# Patient Record
Sex: Female | Born: 1975
Health system: Southern US, Community
[De-identification: ages and names within clinical notes are randomized; demographics above are authoritative.]

## PROBLEM LIST (undated history)

## (undated) DIAGNOSIS — O24419 Gestational diabetes mellitus in pregnancy, unspecified control: Secondary | ICD-10-CM

## (undated) DIAGNOSIS — I10 Essential (primary) hypertension: Secondary | ICD-10-CM

## (undated) HISTORY — DX: Gestational diabetes mellitus in pregnancy, unspecified control: O24.419

## (undated) HISTORY — PX: OTHER SURGICAL HISTORY: SHX169

---

## 2013-11-08 LAB — OB RESULTS CONSOLE ABO/RH: RH Type: POSITIVE

## 2013-11-08 LAB — OB RESULTS CONSOLE RPR: RPR: NONREACTIVE

## 2013-11-08 LAB — OB RESULTS CONSOLE ANTIBODY SCREEN: Antibody Screen: NEGATIVE

## 2013-11-08 LAB — OB RESULTS CONSOLE RUBELLA ANTIBODY, IGM: Rubella: IMMUNE

## 2013-11-08 LAB — OB RESULTS CONSOLE HIV ANTIBODY (ROUTINE TESTING): HIV: NONREACTIVE

## 2013-11-08 LAB — OB RESULTS CONSOLE HEPATITIS B SURFACE ANTIGEN: Hepatitis B Surface Ag: NEGATIVE

## 2013-11-21 ENCOUNTER — Other Ambulatory Visit (HOSPITAL_COMMUNITY)
Admission: RE | Admit: 2013-11-21 | Discharge: 2013-11-21 | Disposition: A | Discharge status: Home / Self Care | Payer: BC Managed Care – PPO | Source: Ambulatory Visit | Attending: Obstetrics and Gynecology | Admitting: Obstetrics and Gynecology

## 2013-11-21 DIAGNOSIS — Z01419 Encounter for gynecological examination (general) (routine) without abnormal findings: Secondary | ICD-10-CM | POA: Diagnosis present

## 2013-11-21 DIAGNOSIS — Z113 Encounter for screening for infections with a predominantly sexual mode of transmission: Secondary | ICD-10-CM | POA: Diagnosis present

## 2013-11-21 DIAGNOSIS — Z1151 Encounter for screening for human papillomavirus (HPV): Secondary | ICD-10-CM | POA: Insufficient documentation

## 2014-01-29 ENCOUNTER — Encounter: Payer: BC Managed Care – PPO | Attending: Obstetrics and Gynecology

## 2014-01-29 VITALS — Ht 64.5 in | Wt 146.9 lb

## 2014-01-29 DIAGNOSIS — Z713 Dietary counseling and surveillance: Secondary | ICD-10-CM | POA: Diagnosis not present

## 2014-01-29 DIAGNOSIS — O24419 Gestational diabetes mellitus in pregnancy, unspecified control: Secondary | ICD-10-CM | POA: Diagnosis not present

## 2014-01-29 NOTE — Progress Notes (Signed)
  Patient was seen on 01/29/14 for Gestational Diabetes self-management . The following learning objectives were met by the patient :   States the definition of Gestational Diabetes  States why dietary management is important in controlling blood glucose  Describes the effects of carbohydrates on blood glucose levels  Demonstrates ability to create a balanced meal plan  Demonstrates carbohydrate counting   States when to check blood glucose levels  Demonstrates proper blood glucose monitoring techniques  States the effect of stress and exercise on blood glucose levels  States the importance of limiting caffeine and abstaining from alcohol and smoking  Plan:  Aim for 2 Carb Choices per meal (30 grams) +/- 1 either way for breakfast Aim for 3 Carb Choices per meal (45 grams) +/- 1 either way from lunch and dinner Aim for 1-2 Carbs per snack Begin reading food labels for Total Carbohydrate and sugar grams of foods Consider  increasing your activity level by walking daily as tolerated Begin checking BG before breakfast and 2 hours after first bit of breakfast, lunch and dinner after  as directed by MD  Take medication  as directed by MD  Blood glucose monitor given: Provided by Provider  Patient instructed to monitor glucose levels: FBS: 60 - <90 2 hour: <120  Patient received the following handouts:  Nutrition Diabetes and Pregnancy  Carbohydrate Counting List  Meal Planning worksheet  Patient will be seen for follow-up as needed.

## 2014-03-14 NOTE — L&D Delivery Note (Signed)
Vaginal Delivery Note The pt utilized an  epidural as pain management.   Spontaneous rupture of membranes today, at 0001, clear.  GBS was positive, Amp x 1 doses were given.  Cervical dilation was complete at  0001.    Pushing with guidance began at  0015.   After 25 seconds of self directed pushing the head, shoulders and the body of a viable female infant "Kellie ShropshireGavin" delivered spontaneously with maternal effort in the ROA position at 0015   With vigorous tone and spontaneous cry, the infant was placed on moms abd.  The cord was clamped, cut and the infant was handed to the NICU team/nursing staff for immediate assessment .    Spontaneous delivery of a intact placenta with a 3 vessel cord via Shultz at  Dorothy0020.   Episiotomy: None   The vulva, perineum, vaginal vault, rectum and cervix were inspected no repairs needed.  Postpartum pitocin as ordered.  Fundus firm, lochia minimum, bleeding under control.  EBL 100, Pt hemodynamically stable.   Sponge, laps and needle count correct and verified with the primary care nurse.  Attending MD available at all times.    Mom and baby were left in stable condition, baby skin to skin. Routine postpartum orders   Mother unsure about method of contraception Or Mother desires BTL 3 months postpartum    Placenta to pathology: NO      Cord Gases sent to lab: NO Cord blood sent to lab: YES   APGARS:  8 at 1 minute and 9 at 5 minutes Weight:. 5lbs 14.2oz     Denver Harder, CNM, MSN 04/10/2014. 2:49 AM

## 2014-04-09 ENCOUNTER — Inpatient Hospital Stay (HOSPITAL_COMMUNITY)
Admission: AD | Admit: 2014-04-09 | Discharge: 2014-04-12 | DRG: 774 | Disposition: A | Discharge status: Home / Self Care | Payer: BLUE CROSS/BLUE SHIELD | Source: Ambulatory Visit | Attending: Obstetrics and Gynecology | Admitting: Obstetrics and Gynecology

## 2014-04-09 ENCOUNTER — Inpatient Hospital Stay (HOSPITAL_COMMUNITY): Payer: BLUE CROSS/BLUE SHIELD | Admitting: Anesthesiology

## 2014-04-09 ENCOUNTER — Encounter (HOSPITAL_COMMUNITY): Payer: Self-pay | Admitting: *Deleted

## 2014-04-09 DIAGNOSIS — O24410 Gestational diabetes mellitus in pregnancy, diet controlled: Secondary | ICD-10-CM | POA: Diagnosis present

## 2014-04-09 DIAGNOSIS — O99824 Streptococcus B carrier state complicating childbirth: Secondary | ICD-10-CM | POA: Diagnosis present

## 2014-04-09 DIAGNOSIS — Z3A35 35 weeks gestation of pregnancy: Secondary | ICD-10-CM | POA: Diagnosis present

## 2014-04-09 DIAGNOSIS — O09523 Supervision of elderly multigravida, third trimester: Secondary | ICD-10-CM | POA: Diagnosis not present

## 2014-04-09 DIAGNOSIS — O1092 Unspecified pre-existing hypertension complicating childbirth: Secondary | ICD-10-CM | POA: Diagnosis present

## 2014-04-09 HISTORY — DX: Essential (primary) hypertension: I10

## 2014-04-09 LAB — GLUCOSE, CAPILLARY: Glucose-Capillary: 94 mg/dL (ref 70–99)

## 2014-04-09 LAB — URINALYSIS, ROUTINE W REFLEX MICROSCOPIC
Bilirubin Urine: NEGATIVE
GLUCOSE, UA: NEGATIVE mg/dL
Ketones, ur: NEGATIVE mg/dL
NITRITE: NEGATIVE
PH: 6.5 (ref 5.0–8.0)
PROTEIN: NEGATIVE mg/dL
Specific Gravity, Urine: 1.01 (ref 1.005–1.030)
Urobilinogen, UA: 0.2 mg/dL (ref 0.0–1.0)

## 2014-04-09 LAB — TYPE AND SCREEN
ABO/RH(D): O POS
Antibody Screen: NEGATIVE

## 2014-04-09 LAB — COMPREHENSIVE METABOLIC PANEL
ALBUMIN: 3.1 g/dL — AB (ref 3.5–5.2)
ALT: 13 U/L (ref 0–35)
ANION GAP: 7 (ref 5–15)
AST: 25 U/L (ref 0–37)
Alkaline Phosphatase: 67 U/L (ref 39–117)
BUN: 9 mg/dL (ref 6–23)
CO2: 21 mmol/L (ref 19–32)
Calcium: 9 mg/dL (ref 8.4–10.5)
Chloride: 108 mmol/L (ref 96–112)
Creatinine, Ser: 0.42 mg/dL — ABNORMAL LOW (ref 0.50–1.10)
GFR calc non Af Amer: >90 mL/min (ref >90)
Glucose, Bld: 76 mg/dL (ref 70–99)
Potassium: 4.1 mmol/L (ref 3.5–5.1)
SODIUM: 136 mmol/L (ref 135–145)
TOTAL PROTEIN: 7.4 g/dL (ref 6.0–8.3)
Total Bilirubin: 0.3 mg/dL (ref 0.3–1.2)

## 2014-04-09 LAB — URINE MICROSCOPIC-ADD ON

## 2014-04-09 LAB — CBC
HEMATOCRIT: 33 % — AB (ref 36.0–46.0)
HEMOGLOBIN: 11.2 g/dL — AB (ref 12.0–15.0)
MCH: 30.6 pg (ref 26.0–34.0)
MCHC: 33.9 g/dL (ref 30.0–36.0)
MCV: 90.2 fL (ref 78.0–100.0)
Platelets: 320 10*3/uL (ref 150–400)
RBC: 3.66 MIL/uL — AB (ref 3.87–5.11)
RDW: 12.5 % (ref 11.5–15.5)
WBC: 13.1 10*3/uL — AB (ref 4.0–10.5)

## 2014-04-09 LAB — PROTEIN / CREATININE RATIO, URINE
Creatinine, Urine: 22 mg/dL
PROTEIN CREATININE RATIO: 0.73 — AB (ref 0.00–0.15)
TOTAL PROTEIN, URINE: 16 mg/dL

## 2014-04-09 LAB — OB RESULTS CONSOLE GBS: GBS: POSITIVE

## 2014-04-09 LAB — GROUP B STREP BY PCR: Group B strep by PCR: POSITIVE — AB

## 2014-04-09 MED ORDER — OXYTOCIN 40 UNITS IN LACTATED RINGERS INFUSION - SIMPLE MED
62.5000 mL/h | INTRAVENOUS | Status: DC
  Administered 2014-04-10: 62.5 mL/h via INTRAVENOUS
  Filled 2014-04-09: qty 1000

## 2014-04-09 MED ORDER — LACTATED RINGERS IV SOLN
INTRAVENOUS | Status: DC
  Administered 2014-04-09 (×2): via INTRAVENOUS

## 2014-04-09 MED ORDER — ONDANSETRON HCL 4 MG/2ML IJ SOLN: 4.0000 mg | Freq: Four times a day (QID) | INTRAMUSCULAR | Status: DC | PRN

## 2014-04-09 MED ORDER — PHENYLEPHRINE 40 MCG/ML (10ML) SYRINGE FOR IV PUSH (FOR BLOOD PRESSURE SUPPORT)
80.0000 ug | PREFILLED_SYRINGE | INTRAVENOUS | Status: DC | PRN
  Filled 2014-04-09: qty 2

## 2014-04-09 MED ORDER — PHENYLEPHRINE 40 MCG/ML (10ML) SYRINGE FOR IV PUSH (FOR BLOOD PRESSURE SUPPORT)
80.0000 ug | PREFILLED_SYRINGE | INTRAVENOUS | Status: DC | PRN
  Filled 2014-04-09 (×2): qty 20
  Filled 2014-04-09: qty 2

## 2014-04-09 MED ORDER — EPHEDRINE 5 MG/ML INJ
10.0000 mg | INTRAVENOUS | Status: DC | PRN
  Filled 2014-04-09: qty 2

## 2014-04-09 MED ORDER — PENICILLIN G POTASSIUM 5000000 UNITS IJ SOLR
5.0000 10*6.[IU] | Freq: Once | INTRAVENOUS | Status: DC
  Filled 2014-04-09: qty 5

## 2014-04-09 MED ORDER — FENTANYL 2.5 MCG/ML BUPIVACAINE 1/10 % EPIDURAL INFUSION (WH - ANES)
14.0000 mL/h | INTRAMUSCULAR | Status: DC | PRN
  Administered 2014-04-09: 14 mL/h via EPIDURAL
  Filled 2014-04-09 (×2): qty 125

## 2014-04-09 MED ORDER — OXYTOCIN BOLUS FROM INFUSION
500.0000 mL | INTRAVENOUS | Status: DC
  Administered 2014-04-10: 500 mL via INTRAVENOUS

## 2014-04-09 MED ORDER — BUTORPHANOL TARTRATE 1 MG/ML IJ SOLN
1.0000 mg | INTRAMUSCULAR | Status: DC | PRN
  Administered 2014-04-09: 1 mg via INTRAVENOUS
  Filled 2014-04-09: qty 1

## 2014-04-09 MED ORDER — LACTATED RINGERS IV SOLN
500.0000 mL | Freq: Once | INTRAVENOUS | Status: AC
  Administered 2014-04-09: 500 mL via INTRAVENOUS

## 2014-04-09 MED ORDER — ACETAMINOPHEN 325 MG PO TABS: 650.0000 mg | ORAL_TABLET | ORAL | Status: DC | PRN

## 2014-04-09 MED ORDER — METHYLDOPA 250 MG PO TABS
250.0000 mg | ORAL_TABLET | Freq: Two times a day (BID) | ORAL | Status: DC
  Filled 2014-04-09 (×2): qty 1

## 2014-04-09 MED ORDER — OXYCODONE-ACETAMINOPHEN 5-325 MG PO TABS: 2.0000 | ORAL_TABLET | ORAL | Status: DC | PRN

## 2014-04-09 MED ORDER — LIDOCAINE HCL (PF) 1 % IJ SOLN
30.0000 mL | INTRAMUSCULAR | Status: DC | PRN
  Filled 2014-04-09: qty 30

## 2014-04-09 MED ORDER — LACTATED RINGERS IV SOLN: 500.0000 mL | INTRAVENOUS | Status: DC | PRN

## 2014-04-09 MED ORDER — ZOLPIDEM TARTRATE 5 MG PO TABS: 5.0000 mg | ORAL_TABLET | Freq: Every evening | ORAL | Status: DC | PRN

## 2014-04-09 MED ORDER — SODIUM CHLORIDE 0.9 % IV SOLN
2.0000 g | Freq: Once | INTRAVENOUS | Status: AC
  Administered 2014-04-09: 2 g via INTRAVENOUS
  Filled 2014-04-09: qty 2000

## 2014-04-09 MED ORDER — LIDOCAINE HCL (PF) 1 % IJ SOLN
INTRAMUSCULAR | Status: DC | PRN
  Administered 2014-04-09 (×2): 5 mL

## 2014-04-09 MED ORDER — OXYCODONE-ACETAMINOPHEN 5-325 MG PO TABS: 1.0000 | ORAL_TABLET | ORAL | Status: DC | PRN

## 2014-04-09 MED ORDER — DEXTROSE 5 % IV SOLN
2.5000 10*6.[IU] | INTRAVENOUS | Status: DC
  Filled 2014-04-09 (×2): qty 2.5

## 2014-04-09 MED ORDER — CITRIC ACID-SODIUM CITRATE 334-500 MG/5ML PO SOLN: 30.0000 mL | ORAL | Status: DC | PRN

## 2014-04-09 MED ORDER — DIPHENHYDRAMINE HCL 50 MG/ML IJ SOLN: 12.5000 mg | INTRAMUSCULAR | Status: DC | PRN

## 2014-04-09 NOTE — MAU Note (Signed)
[facility]. Will go to 160 when called.

## 2014-04-09 NOTE — Progress Notes (Signed)
Labor Progress  Subjective: Comfortable with epidural  Objective: BP 146/62 mmHg  Pulse 113  Temp(Src) 97.9 F (36.6 C) (Oral)  Resp 18  Ht 5\' 4"  (1.626 m)  Wt 156 lb (70.761 kg)  BMI 26.76 kg/m2  SpO2 98%     FHT: 155, moderate variability, occasional accel, no decel CTX:  irregular, every 2-5 minutes Uterus gravid, soft non tender SVE:  Dilation: 9 Effacement (%): 70, 80 Station: 0 Exam by:: V Yared Barefoot CNM   Assessment:  IUP at 34.5 weeks NICHD: Category Membranes:  BBW Labor progress: PTL GBS: positive   Plan: Continue labor plan Continuous monitoring Rest Frequent position changes to facilitate fetal rotation and descent.   Continue pitocin per protocol Amnioinfusion with bolus of 300 cc, then 150 cc/hr.     Michaele Amundson, CNM, MSN 04/09/2014. 10:57 PM

## 2014-04-09 NOTE — MAU Note (Signed)
Pt on methyldopa for chronic HTN.

## 2014-04-09 NOTE — Progress Notes (Signed)
Labor Progress  Subjective: Called to room at 2000 by care nurse that report pt is ctx and request to be checked.  Pt requesting an epidural  Objective: BP 147/90 mmHg  Pulse 90  Temp(Src) 97.9 F (36.6 C) (Oral)  Resp 18  Ht 5\' 4"  (1.626 m)  Wt 156 lb (70.761 kg)  BMI 26.76 kg/m2     FHT: 135 + accel, moderate variability, occasional variable decel. CTX:  regular, every 4-5 minutes Uterus gravid, soft non tender SVE:  Dilation: 4 Effacement (%): 70, 80 Station: 0 Exam by:: Argel Pablo cnm   Assessment:  IUP at 34.5 weeks NICHD: Category 2 Membranes:  intact Labor progress: PTL GBS: unknown  Plan: Continue plan pr Dr Richardson Doppole  Continuous monitoring Amp for GBS unknown Inform NICU of possible PTD Dr Su Hiltoberts informed     Andrea Lambert, CNM, MSN 04/09/2014. 8:46 PM

## 2014-04-09 NOTE — Anesthesia Procedure Notes (Signed)
Epidural Patient location during procedure: OB Start time: 04/09/2014 10:21 PM  Staffing Anesthesiologist: Brayton CavesJACKSON, Dontarious Schaum Performed by: anesthesiologist   Preanesthetic Checklist Completed: patient identified, site marked, surgical consent, pre-op evaluation, timeout performed, IV checked, risks and benefits discussed and monitors and equipment checked  Epidural Patient position: sitting Prep: site prepped and draped and DuraPrep Patient monitoring: continuous pulse ox and blood pressure Approach: midline Location: L3-L4 Injection technique: LOR air  Needle:  Needle type: Tuohy  Needle gauge: 17 G Needle length: 9 cm and 9 Needle insertion depth: 5 cm cm Catheter type: closed end flexible Catheter size: 19 Gauge Catheter at skin depth: 10 cm Test dose: negative  Assessment Events: blood not aspirated, injection not painful, no injection resistance, negative IV test and no paresthesia  Additional Notes Patient identified.  Risk benefits discussed including failed block, incomplete pain control, headache, nerve damage, paralysis, blood pressure changes, nausea, vomiting, reactions to medication both toxic or allergic, and postpartum back pain.  Patient expressed understanding and wished to proceed.  All questions were answered.  Sterile technique used throughout procedure and epidural site dressed with sterile barrier dressing. No paresthesia or other complications noted.The patient did not experience any signs of intravascular injection such as tinnitus or metallic taste in mouth nor signs of intrathecal spread such as rapid motor block. Please see nursing notes for vital signs.

## 2014-04-09 NOTE — MAU Provider Note (Signed)
History     CSN: [id number]  Arrival date and time: 04/09/14 1639   None     Chief Complaint  Patient presents with  . Contractions  . Vaginal Bleeding   HPI This is a 39 y.o. female at [redacted]w[redacted]d who presents with c/o cramping and spotting all day. + fetal movement. Pregnancy has been remarkable for hypertension and gestational diabetes.   RN Note:  Expand All Collapse All   uc's since 1000 this a.m., noticed blood when wiping, now wearing panti liner. Denies LOF. Reports good FM.           OB History    Gravida Para Term Preterm AB TAB SAB Ectopic Multiple Living   Past Medical History  Diagnosis Date  . Gestational diabetes mellitus, antepartum   . Hypertension     Past Surgical History  Procedure Laterality Date  . None      History reviewed. No pertinent family history.  History  Substance Use Topics  . Smoking status: Never Smoker   . Smokeless tobacco: Not on file  . Alcohol Use: No    Allergies: No Known Allergies  Prescriptions prior to admission  Medication Sig Dispense Refill Last Dose  . methyldopa (ALDOMET) 250 MG tablet Take 250 mg by mouth 2 (two) times daily.   04/09/2014 at Unknown time  . Prenatal Vit-Min-FA-Fish Oil ([employer] PRENATAL GUMMY PO) Take 2 each by mouth daily.   04/09/2014 at Unknown time    Review of Systems  Constitutional: Negative for fever, chills and malaise/fatigue.  Gastrointestinal: Positive for abdominal pain. Negative for nausea and vomiting.  Genitourinary:       Vaginal bleeding, small   Neurological: Negative for headaches.   Physical Exam   Blood pressure 149/88, pulse 91, temperature 98.2 F (36.8 C), temperature source Oral, resp. rate 20.  Physical Exam  Constitutional: She is oriented to person, place, and time. She appears well-developed and well-nourished. No distress.  HENT:  Head: Normocephalic.  Cardiovascular: Normal rate and regular rhythm.   Respiratory: Effort normal  and breath sounds normal.  GI: Soft. Bowel sounds are normal. She exhibits no distension. There is no tenderness. There is no rebound and no guarding.  Genitourinary: Vaginal discharge (bloody show) found.  Cervix 3/70/-3/BBOW  Musculoskeletal: Normal range of motion.  Neurological: She is alert and oriented to person, place, and time.  Skin: Skin is warm and dry.  Psychiatric: She has a normal mood and affect.    MAU Course  Procedures  MDM NST reactive, UCs every 3-5 minutes  Results for orders placed or performed during the hospital encounter of 04/09/14 (from the past 24 hour(s))  Urinalysis, Routine w reflex microscopic     Status: Abnormal   Collection Time: 04/09/14  5:12 PM  Result Value Ref Range   Color, Urine AMBER (A) YELLOW   APPearance CLOUDY (A) CLEAR   Specific Gravity, Urine 1.010 1.005 - 1.030   pH 6.5 5.0 - 8.0   Glucose, UA NEGATIVE NEGATIVE mg/dL   Hgb urine dipstick LARGE (A) NEGATIVE   Bilirubin Urine NEGATIVE NEGATIVE   Ketones, ur NEGATIVE NEGATIVE mg/dL   Protein, ur NEGATIVE NEGATIVE mg/dL   Urobilinogen, UA 0.2 0.0 - 1.0 mg/dL   Nitrite NEGATIVE NEGATIVE   Leukocytes, UA TRACE (A) NEGATIVE  Urine microscopic-add on     Status: Abnormal   Collection Time: 04/09/14  5:12 PM  Result  Value Ref Range   Squamous Epithelial / LPF MANY (A) RARE   WBC, UA 3-6 <3 WBC/hpf   RBC / HPF 0-2 <3 RBC/hpf   Bacteria, UA FEW (A) RARE    Assessment and Plan  A:  SIUP at 518w5d       Preterm Labor      Hypertension, good control      Gestational Diabetes  P:  Discussed with Dr Richardson Doppole       [facility]       PCN until GBS PCR comes back       [facility]  [facility] HospitalWILLIAMS,Laporsche Hoeger 04/09/2014, 5:57 PM

## 2014-04-09 NOTE — MAU Note (Signed)
Pt in room and placed on monitor.

## 2014-04-09 NOTE — Consult Note (Addendum)
Neonatology Consult  Note:  At the request of the patients obstetrician Dr. Landry Mellow I met with Andrea Lambert who is at 86 6  weeks currently with pregnancy complicated by preterm labor, chronic HTN and GDM - diet controlled.   At this gestation age the disposition of the infant is undetermined.  Should he be of sufficient weight and have no respiratory problems and be able to maintain an adequate glucose level he would be a candidate for remaining with mother.  However given his preterm status there is a relatively high chance that he will need further management in the NICU.   We reviewed initial delivery room management, including CPAP, [place], and low but certainly possible need for intubation for surfactant administration.  We discussed feeding immaturity and need for full po intake with multiple days of good weight gain and no apnea or bradycardia before discharge.  We reviewed increased risk of jaundice, infection, and temperature instability.   Discussed likely length of stay.  Thank you for allowing [place] to participate in her care.  Please call with questions.  Higinio Roger, DO  Neonatologist  [phone]   The total length of face-to-face or floor / unit time for this encounter was 20 minutes.  Counseling and / or coordination of care was greater than fifty percent of the time.

## 2014-04-09 NOTE — MAU Note (Signed)
uc's since 1000 this a.m., noticed blood when wiping, now wearing panti liner.  Denies LOF.  Reports good FM.

## 2014-04-09 NOTE — Progress Notes (Signed)
Requested provider to review strip for variables

## 2014-04-09 NOTE — Anesthesia Preprocedure Evaluation (Signed)
Anesthesia Evaluation  Patient identified by MRN, date of birth, ID band Patient awake    Reviewed: Allergy & Precautions, H&P , Patient's Chart, lab work & pertinent test results  Airway Mallampati: II TM Distance: >3 FB Neck ROM: full    Dental   Pulmonary  breath sounds clear to auscultation        Cardiovascular hypertension, Rhythm:regular Rate:Normal     Neuro/Psych    GI/Hepatic   Endo/Other  diabetes  Renal/GU      Musculoskeletal   Abdominal   Peds  Hematology   Anesthesia Other Findings   Reproductive/Obstetrics (+) Pregnancy                           Anesthesia Physical Anesthesia Plan  ASA: III  Anesthesia Plan: Epidural   Post-op Pain Management:    Induction:   Airway Management Planned:   Additional Equipment:   Intra-op Plan:   Post-operative Plan:   Informed Consent: I have reviewed the patients History and Physical, chart, labs and discussed the procedure including the risks, benefits and alternatives for the proposed anesthesia with the patient or authorized representative who has indicated his/her understanding and acceptance.     Plan Discussed with:   Anesthesia Plan Comments:         Anesthesia Quick Evaluation  

## 2014-04-09 NOTE — H&P (Signed)
Andrea Lambert is a 39 y.o. female G2P0101 at 34 wks and 6 days based on LMP of 08/08/2013 confirmed by 18 wk u/s with EDD 05/14/2013. Prenatal care with Dr. Richardson Dopp at [facility]. Pt presented to MAU today complaining of regular contractions and bloody show. She began having contractions at 10 am this morning. On arrival her cervix was check by NP in MUA and found to be 2.5 cm and 70 percent effaced with bloody show and bulging bag of water. Pt states contractions are regular although they are not graphing well on toco. She denies LOF. +FM.   Her pregnancy has by chronic hypertension and gestational diabetes that is diet controlled.   u/s for efw on 03/27/2014 . ( 4 lbs 11 0z) 60%ile   BPP 04/07/2014 that was 8/8 vertex presentation   Meds: Aldomet 250 mg bid  PNV Allergies NKDA.   History OB History    Gravida Para Term Preterm AB TAB SAB Ectopic Multiple Living   SVD 10/10/2013 at 36 wks and 5 days . Induced at [facility] center due to hypertension and gestational diabetes.    Past Medical History  Diagnosis Date  . Gestational diabetes mellitus, antepartum   . Hypertension    Past Surgical History  Procedure Laterality Date  . None     Family History: family history is not on file. Social History:  reports that she has never smoked. She does not have any smokeless tobacco history on file. She reports that she does not drink alcohol or use illicit drugs.   Prenatal Transfer Tool  Maternal Diabetes: Yes:  Diabetes Type:  Diet controlled Genetic Screening: Normal Maternal Ultrasounds/Referrals: Normal Fetal Ultrasounds or other Referrals:  None Maternal Substance Abuse:  No Significant Maternal Medications:  Meds include: Other:  Significant Maternal Lab Results:  Lab values include: Other:  GBS is pending  Other Comments:  None  Review of Systems  All other systems reviewed and are negative.   Dilation: 2.5 Effacement (%): 70,  80 Station: Ballotable Exam by:: Dr. Richardson Dopp Blood pressure 149/88, pulse 91, temperature 98.2 F (36.8 C), temperature source Oral, resp. rate 20. Maternal Exam:  Uterine Assessment: Contraction strength is mild.  Contraction frequency is irregular.   Abdomen: Patient reports no abdominal tenderness. Fetal presentation: vertex  Introitus: Normal vulva. Normal vagina.    Fetal Exam Fetal Monitor Review: Mode: fetoscope.   Baseline rate: 120.  Variability: moderate (6-25 bpm).   Pattern: accelerations present and variable decelerations.    Fetal State Assessment: Category I - tracings are normal.     Physical Exam  Constitutional: She is oriented to person, place, and time. She appears well-developed and well-nourished.  HENT:  Head: Normocephalic.  Neck: Normal range of motion.  Cardiovascular: Normal rate and regular rhythm.   Respiratory: Effort normal.  GI: There is no tenderness.  Genitourinary: Vagina normal and uterus normal. Guaiac negative stool.  Musculoskeletal: Normal range of motion.  Neurological: She is alert and oriented to person, place, and time.  Skin: Skin is warm and dry.  Psychiatric: She has a normal mood and affect.    Prenatal labs: ABO, Rh:   O positive  Antibody:   Negative Rubella:  Immune  RPR:   Nonereactive  HBsAg:   Negative  HIV:   Negative  GBS:   Pending.   Assessment/Plan: 34 wks and 6 days with preterm labor  .Marland Kitchen  NICU consult  Will admit to labor and deliver for expectant management. If contractions stall and she remains unchanged and stable consider d/c home tomorrow. IV fluid bolus 1 Liter GBS pending rapid pcr sent Stadol for pain control. Pt desires epidural if she progresses to active labor  HTN continue aldomet 250 mg bid  PIH labs pending   gestational diabetes plan to check cbg q 4 hours  Dr. Su Hiltoberts with [facility] covering this evening after 7pm and Dr. Dion BodyVarnado taking over 04/10/2014 at 7 am.    Nomar Broad J. 04/09/2014, 6:57  PM

## 2014-04-10 ENCOUNTER — Encounter (HOSPITAL_COMMUNITY): Payer: Self-pay | Admitting: *Deleted

## 2014-04-10 LAB — CBC
HCT: 30.7 % — ABNORMAL LOW (ref 36.0–46.0)
HEMATOCRIT: 32.7 % — AB (ref 36.0–46.0)
HEMOGLOBIN: 10.3 g/dL — AB (ref 12.0–15.0)
Hemoglobin: 11.1 g/dL — ABNORMAL LOW (ref 12.0–15.0)
MCH: 30.7 pg (ref 26.0–34.0)
MCH: 30.2 pg (ref 26.0–34.0)
MCHC: 33.9 g/dL (ref 30.0–36.0)
MCHC: 33.6 g/dL (ref 30.0–36.0)
MCV: 90.3 fL (ref 78.0–100.0)
MCV: 90 fL (ref 78.0–100.0)
Platelets: 272 10*3/uL (ref 150–400)
Platelets: 280 10*3/uL (ref 150–400)
RBC: 3.62 MIL/uL — AB (ref 3.87–5.11)
RBC: 3.41 MIL/uL — AB (ref 3.87–5.11)
RDW: 12.4 % (ref 11.5–15.5)
RDW: 12.4 % (ref 11.5–15.5)
WBC: 20 10*3/uL — AB (ref 4.0–10.5)
WBC: 17.7 10*3/uL — ABNORMAL HIGH (ref 4.0–10.5)

## 2014-04-10 LAB — ABO/RH: ABO/RH(D): O POS

## 2014-04-10 MED ORDER — WITCH HAZEL-GLYCERIN EX PADS: 1.0000 "application " | MEDICATED_PAD | CUTANEOUS | Status: DC | PRN

## 2014-04-10 MED ORDER — OXYCODONE-ACETAMINOPHEN 5-325 MG PO TABS: 2.0000 | ORAL_TABLET | ORAL | Status: DC | PRN

## 2014-04-10 MED ORDER — SIMETHICONE 80 MG PO CHEW: 80.0000 mg | CHEWABLE_TABLET | ORAL | Status: DC | PRN

## 2014-04-10 MED ORDER — SENNOSIDES-DOCUSATE SODIUM 8.6-50 MG PO TABS
2.0000 | ORAL_TABLET | ORAL | Status: DC
  Administered 2014-04-10 – 2014-04-12 (×2): 2 via ORAL
  Filled 2014-04-10 (×2): qty 2

## 2014-04-10 MED ORDER — TETANUS-DIPHTH-ACELL PERTUSSIS 5-2.5-18.5 LF-MCG/0.5 IM SUSP
0.5000 mL | Freq: Once | INTRAMUSCULAR | Status: AC
  Administered 2014-04-10: 0.5 mL via INTRAMUSCULAR
  Filled 2014-04-10: qty 0.5

## 2014-04-10 MED ORDER — PRENATAL MULTIVITAMIN CH
1.0000 | ORAL_TABLET | Freq: Every day | ORAL | Status: DC
  Administered 2014-04-10 – 2014-04-11 (×2): 1 via ORAL
  Filled 2014-04-10 (×2): qty 1

## 2014-04-10 MED ORDER — ONDANSETRON HCL 4 MG/2ML IJ SOLN: 4.0000 mg | INTRAMUSCULAR | Status: DC | PRN

## 2014-04-10 MED ORDER — ZOLPIDEM TARTRATE 5 MG PO TABS: 5.0000 mg | ORAL_TABLET | Freq: Every evening | ORAL | Status: DC | PRN

## 2014-04-10 MED ORDER — DIBUCAINE 1 % RE OINT: 1.0000 "application " | TOPICAL_OINTMENT | RECTAL | Status: DC | PRN

## 2014-04-10 MED ORDER — BENZOCAINE-MENTHOL 20-0.5 % EX AERO: 1.0000 "application " | INHALATION_SPRAY | CUTANEOUS | Status: DC | PRN

## 2014-04-10 MED ORDER — LANOLIN HYDROUS EX OINT
TOPICAL_OINTMENT | CUTANEOUS | Status: DC | PRN
Start: 2014-04-10 — End: 2014-04-12

## 2014-04-10 MED ORDER — METHYLDOPA 250 MG PO TABS
250.0000 mg | ORAL_TABLET | Freq: Two times a day (BID) | ORAL | Status: DC
  Administered 2014-04-10 – 2014-04-12 (×5): 250 mg via ORAL
  Filled 2014-04-10 (×7): qty 1

## 2014-04-10 MED ORDER — IBUPROFEN 600 MG PO TABS
600.0000 mg | ORAL_TABLET | Freq: Four times a day (QID) | ORAL | Status: DC
  Administered 2014-04-10 – 2014-04-12 (×9): 600 mg via ORAL
  Filled 2014-04-10 (×9): qty 1

## 2014-04-10 MED ORDER — ONDANSETRON HCL 4 MG PO TABS: 4.0000 mg | ORAL_TABLET | ORAL | Status: DC | PRN

## 2014-04-10 MED ORDER — DIPHENHYDRAMINE HCL 25 MG PO CAPS: 25.0000 mg | ORAL_CAPSULE | Freq: Four times a day (QID) | ORAL | Status: DC | PRN

## 2014-04-10 MED ORDER — OXYCODONE-ACETAMINOPHEN 5-325 MG PO TABS: 1.0000 | ORAL_TABLET | ORAL | Status: DC | PRN

## 2014-04-10 NOTE — Progress Notes (Signed)
Postpartum day #0, NSVD, Preterm delivery at 34 6/7 weeks  Subjective Pt without complaints.  Lochia normal.  Pain controlled.  Breast feeding yes. Baby boy doing well in NICU. Pt denies headaches, visual changes.  Temp:  [97.9 F (36.6 C)-99.1 F (37.3 C)] 97.9 F (36.6 C) (01/28 0415) Pulse Rate:  [84-116] 103 (01/28 0415) Resp:  [18-20] 18 (01/28 0415) BP: (116-167)/(62-102) 123/69 mmHg (01/28 0415) SpO2:  [98 %-100 %] 98 % (01/28 0304) Weight:  [70.761 kg (156 lb)] 70.761 kg (156 lb) (01/27 1956)  Gen:  NAD, A&O x 3 Uterine fundus:  Firm, nontender Lochia normal Ext:  +Edema, no calf tenderness bilaterally  CBC    Component Value Date/Time   WBC 17.7* 04/10/2014 0530   RBC 3.41* 04/10/2014 0530   HGB 10.3* 04/10/2014 0530   HCT 30.7* 04/10/2014 0530   PLT 280 04/10/2014 0530   MCV 90.0 04/10/2014 0530   MCH 30.2 04/10/2014 0530   MCHC 33.6 04/10/2014 0530   RDW 12.4 04/10/2014 0530     A/P: S/p SVD doing well. Chronic HTN-BP well controlled on Aldomet 250 mg BID.  Next dose due at 1000.  Routine postpartum care. Monitor BP closely to assess for s/sxs of Superimposed Preeclampsia. Lactation support. Desires inpatient circumcision. Discharge PPD #2.  Dr. Richardson Doppole to assume care tomorrow at 7 am.  [name], Andrea Sevey 04/10/2014, 8:21 AM

## 2014-04-10 NOTE — Lactation Note (Signed)
This note was copied from the chart of Andrea Riki RuskMaria Mcglamery. Lactation Consultation Note          Initial consult with this mom of a NICU baby, now 8511 hours old, and 34 6/7 weeks CGA. Mom was pumping when I walked in the room. She states she was not able to latch her first baby, who was also a LPT infant. I told her that her baby should be able to transition to breastfeeding, with lactation support. I showed mom how to set premie setting, and reviewed pumping teaching with her. I showed mom how to hand express, and shw was able to collect a few drops of colostrum. Mom has not visited the NICU yet. She was asking me if I thought her baby would survive. I told her yes, and encouraged her to go and visit and hold him skin to skin. I spoke to SpicerErin, baby's nurse, and she told mom that the baby is stable enough to be held. Mom has a personal DEP at home.   Patient Name: Andrea Lambert VHQIO'NToday's Date: 04/10/2014 Reason for consult: Initial assessment;NICU baby;Late preterm infant   Maternal Data Formula Feeding for Exclusion: Yes (baby in NICU) Has patient been taught Hand Expression?: Yes Does the patient have breastfeeding experience prior to this delivery?: Yes  Feeding    LATCH Score/Interventions                      Lactation Tools Discussed/Used WIC Program: No Pump Review: Setup, frequency, and cleaning;Milk Storage;Other (comment) (premie setting, reviw of NICU booklet) Initiated by:: bedside rn Date initiated:: 04/10/14   Consult Status Consult Status: Follow-up Date: 04/11/14 Follow-up type: In-patient    Alfred LevinsLee, Etosha Wetherell Anne 04/10/2014, 11:33 AM

## 2014-04-10 NOTE — Anesthesia Postprocedure Evaluation (Signed)
  Anesthesia Post-op Note  Patient: Andrea Lambert  Procedure(s) Performed: * No procedures listed *  Patient Location: Mother/Baby  Anesthesia Type:Epidural  Level of Consciousness: awake, alert , oriented and patient cooperative  Airway and Oxygen Therapy: Patient Spontanous Breathing  Post-op Pain: mild  Post-op Assessment: Post-op Vital signs reviewed, Patient's Cardiovascular Status Stable, Respiratory Function Stable, Patent Airway, No headache, No backache, No residual numbness and No residual motor weakness  Post-op Vital Signs: Reviewed and stable  Last Vitals:  Filed Vitals:   04/10/14 0415  BP: 123/69  Pulse: 103  Temp: 36.6 C  Resp: 18    Complications: No apparent anesthesia complications

## 2014-04-11 MED ORDER — BISACODYL 10 MG RE SUPP
10.0000 mg | Freq: Once | RECTAL | Status: DC
  Filled 2014-04-11: qty 1

## 2014-04-11 NOTE — Progress Notes (Signed)
Dr Richardson Doppole called at pt's request.  Last BM on Wednesday 04/09/14 and pt c/o being uncomfortable

## 2014-04-11 NOTE — Lactation Note (Signed)
This note was copied from the chart of Boy Riki RuskMaria Madan. Lactation Consultation Note  Patient Name: Boy Riki RuskMaria Rothe ONGEX'BToday's Date: 04/11/2014 Reason for consult: Follow-up assessment NICU baby, 40 hours of life. Mom using DEBP when LC entered room. Mom states that first baby not able to latch, so she pumped and bottle-fed EBM for 6 months. Mom states her supply was low and she supplemented with formula throughout those 6 months with first child. Mom return-demonstrated hand expression with a couple of drops of colostrum. Enc mom to resume pumping for 15 minutes, and then to hand express afterwards. Enc mom to pump every 3 hours and at least once at night. Mom has small bottles and stickers for NICU. Mom aware that EBM can sit out for 4 hours at room temperature. Enc mom to call out for assistance as needed with pumping.  Maternal Data    Feeding Feeding Type: Formula Nipple Type: Slow - flow Length of feed: 30 min  LATCH Score/Interventions                      Lactation Tools Discussed/Used     Consult Status Consult Status: Follow-up Date: 04/12/14 Follow-up type: In-patient    Geralynn OchsWILLIARD, Lexxi Koslow 04/11/2014, 4:21 PM

## 2014-04-11 NOTE — Progress Notes (Signed)
Clinical Social Work Department PSYCHOSOCIAL ASSESSMENT - MATERNAL/CHILD 04/11/2014  Patient:  Andrea Lambert, Andrea Lambert  Account Number:  [id number]  Admit Date:  04/09/2014  Marjo Bicker Name:   Andrea Lambert    Clinical Social Worker:  Lulu Riding, LCSW   Date/Time:  04/11/2014 09:30 AM  Date Referred:        Other referral source:   No referral-NICU admission    I:  FAMILY / HOME ENVIRONMENT Child's legal guardian:  PARENT  Guardian - Name Guardian - Age Guardian - Address  Andrea Lambert 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 Chestine Spore   IV  RISK FACTORS AND CURRENT PROBLEMS Current Problem:  None   Risk Factor & Current Problem Patient Issue Family Issue Risk Factor / Current Problem Comment   N N     V  SOCIAL WORK ASSESSMENT  CSW met with MOB at baby's bedside to introduce myself, complete assessment due to baby's admission to NICU and to offer support.  MOB was very pleasant and welcoming of CSW's visit.  She was holding baby skin to skin and bonding is evident.  She states baby is doing better, which makes her feel better.  She appears to have a good understanding of his need for  NICU intervention.  MOB states she has one other child, a two year old named Andrea Lambert, at home.  Her husband is caring for that child while she is in the hospital.  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  She states they have everything they need ready for baby at home.  She appears to coping well emotionally with baby's medical situation at this time and states no emotional concerns.  She did, however, state that she does not understand why baby came early.  She spoke about going into labor and calling her nurse to ask why she was having contractions at 35 weeks.  CSW and MOB talked about how babies come early at times, sometimes with no explanation.  CSW discussed the possibility of emotionality given the change in expectation of what the end of pregnancy/birth would be.  MOB was attentive and understanding.  CSW explained ongoing support services and asked MOB to call if she feels she would benefit from talking.  CSW provided contact information.  CSW has no social concerns at this time.   VI SOCIAL WORK PLAN Social Work Plan  Psychosocial Support/Ongoing Assessment of Needs  Patient/Family Education   Type of pt/family education:   Ongoing support services offered by NICU CSW   If child protective services report - county:   If child protective services report -  date:   Information/referral to community resources comment:   No referral needs noted at this time.   Other social work plan:

## 2014-04-11 NOTE — Progress Notes (Signed)
Post Partum Day 1 s/p svd  Subjective: no complaints, up ad lib, voiding and tolerating PO  Objective: Blood pressure 114/65, pulse 72, temperature 98.4 F (36.9 C), temperature source Oral, resp. rate 16, height 5\' 4"  (1.626 m), weight 70.761 kg (156 lb), SpO2 98 %, unknown if currently breastfeeding.  Physical Exam:  General: alert and cooperative Lochia: appropriate Uterine Fundus: firm Incision: NA DVT Evaluation: No evidence of DVT seen on physical exam.   Recent Labs  04/10/14 0150 04/10/14 0530  HGB 11.1* 10.3*  HCT 32.7* 30.7*    Assessment/Plan: Plan for discharge tomorrow and Breastfeeding  Baby in NIcu but doing well    LOS: 2 days   Ugochukwu Chichester J. 04/11/2014, 7:40 AM

## 2014-04-12 MED ORDER — IBUPROFEN 600 MG PO TABS: 600.0000 mg | ORAL_TABLET | Freq: Four times a day (QID) | ORAL | Status: AC | PRN

## 2014-04-12 NOTE — Lactation Note (Signed)
This note was copied from the chart of Andrea Riki RuskMaria Rhatigan. Lactation Consultation Note   Mother's breasts are filling.  Reminder her to pump every 3 hours.  She may skip one or two sessions during the night. Discussed transporting breastmilk in gels packs and engorgement care. Provided mother w/ more colostrum bottles per her request.  Patient Name: Andrea Lambert [id number]'s Date: 04/12/2014     Maternal Data    Feeding Feeding Type: Formula Length of feed: 30 min  LATCH Score/Interventions                      Lactation Tools Discussed/Used     Consult Status      Hardie PulleyBerkelhammer, Gesenia Bantz Boschen 04/12/2014, 11:44 AM

## 2014-04-12 NOTE — Discharge Summary (Signed)
Obstetric Discharge Summary Reason for Admission: onset of labor Prenatal Procedures: none Intrapartum Procedures: spontaneous vaginal delivery Postpartum Procedures: none Complications-Operative and Postpartum: none HEMOGLOBIN  Date Value Ref Range Status  04/10/2014 10.3* 12.0 - 15.0 g/dL Final   HCT  Date Value Ref Range Status  04/10/2014 30.7* 36.0 - 46.0 % Final    Physical Exam:  General: alert and cooperative Lochia: appropriate Uterine Fundus: firm Incision: NA DVT Evaluation: No evidence of DVT seen on physical exam.  Discharge Diagnoses: preterm labor delivered at 35 wks   Discharge Information: Date: 04/12/2014 Activity: pelvic rest Diet: routine Medications: PNV, Ibuprofen and aldomet Condition: stable Instructions: refer to practice specific booklet Discharge to: home Follow-up Information    Follow up with Jessee AversOLE,Natanel Snavely J., MD In 6 weeks.   Specialty:  Obstetrics and Gynecology   Why:  postpartum visit    Contact information:   [address] [place] [address] [phone]       Newborn Data: Live born female  Birth Weight: 5 lb 14.2 oz (2670 g) APGAR: 8, 9  Home with mother.  Andrea Tejada J. 04/12/2014, 9:19 AM

## 2014-04-14 ENCOUNTER — Ambulatory Visit: Payer: Self-pay

## 2014-04-14 LAB — HIV ANTIBODY (ROUTINE TESTING W REFLEX): HIV Screen 4th Generation wRfx: NONREACTIVE

## 2014-04-14 LAB — RPR: RPR: NONREACTIVE

## 2014-04-14 NOTE — Lactation Note (Signed)
This note was copied from the chart of [place]. Lactation Consultation Note     Brief consult with this mom in the NICU while she ws holding her baby. Mom is not pumping 8 times a day, and not bringing her pumping kit to the NICU. She depends on her husband to to bring her, and said she does not have time to pump when visiting, since mom and dad take turns watching the sibling outside the unit. I explained supply an demand to mom, and encouraged ehr to increse her frequency, and the importanc e of also pumping at night. Mom said she will try. She knows  To ask for lactation with questions/concerns.   Patient Name: Andrea Lambert [id number] Date: 04/14/2014 Reason for consult: Follow-up assessment   Maternal Data    Feeding Feeding Type: Formula Length of feed: 40 min  LATCH Score/Interventions                      Lactation Tools Discussed/Used     Consult Status Consult Status: PRN Follow-up type: In-patient (NICU)    Tonna Corner 04/14/2014, 10:46 AM

## 2014-04-18 ENCOUNTER — Encounter (HOSPITAL_COMMUNITY): Payer: Self-pay | Admitting: *Deleted

## 2014-04-18 ENCOUNTER — Inpatient Hospital Stay (HOSPITAL_COMMUNITY)
Admission: AD | Admit: 2014-04-18 | Discharge: 2014-04-22 | DRG: 776 | Disposition: A | Discharge status: Home / Self Care | Payer: BLUE CROSS/BLUE SHIELD | Source: Ambulatory Visit | Attending: Obstetrics and Gynecology | Admitting: Obstetrics and Gynecology

## 2014-04-18 DIAGNOSIS — O8621 Infection of kidney following delivery: Principal | ICD-10-CM | POA: Diagnosis present

## 2014-04-18 DIAGNOSIS — B9620 Unspecified Escherichia coli [E. coli] as the cause of diseases classified elsewhere: Secondary | ICD-10-CM | POA: Diagnosis present

## 2014-04-18 DIAGNOSIS — Z8632 Personal history of gestational diabetes: Secondary | ICD-10-CM

## 2014-04-18 DIAGNOSIS — O1003 Pre-existing essential hypertension complicating the puerperium: Secondary | ICD-10-CM | POA: Diagnosis present

## 2014-04-18 DIAGNOSIS — Z1611 Resistance to penicillins: Secondary | ICD-10-CM | POA: Diagnosis present

## 2014-04-18 DIAGNOSIS — N10 Acute tubulo-interstitial nephritis: Secondary | ICD-10-CM | POA: Diagnosis present

## 2014-04-18 DIAGNOSIS — O864 Pyrexia of unknown origin following delivery: Secondary | ICD-10-CM | POA: Diagnosis not present

## 2014-04-18 DIAGNOSIS — N12 Tubulo-interstitial nephritis, not specified as acute or chronic: Secondary | ICD-10-CM | POA: Diagnosis present

## 2014-04-18 LAB — CBC WITH DIFFERENTIAL/PLATELET
Basophils Absolute: 0 10*3/uL (ref 0.0–0.1)
Basophils Relative: 0 % (ref 0–1)
Eosinophils Absolute: 0.3 10*3/uL (ref 0.0–0.7)
Eosinophils Relative: 2 % (ref 0–5)
HEMATOCRIT: 38.5 % (ref 36.0–46.0)
HEMOGLOBIN: 12.4 g/dL (ref 12.0–15.0)
LYMPHS ABS: 2.5 10*3/uL (ref 0.7–4.0)
Lymphocytes Relative: 18 % (ref 12–46)
MCH: 30.1 pg (ref 26.0–34.0)
MCHC: 32.2 g/dL (ref 30.0–36.0)
MCV: 93.4 fL (ref 78.0–100.0)
MONO ABS: 0.2 10*3/uL (ref 0.1–1.0)
MONOS PCT: 2 % — AB (ref 3–12)
NEUTROS PCT: 78 % — AB (ref 43–77)
Neutro Abs: 10.9 10*3/uL — ABNORMAL HIGH (ref 1.7–7.7)
PLATELETS: 472 10*3/uL — AB (ref 150–400)
RBC: 4.12 MIL/uL (ref 3.87–5.11)
RDW: 12.7 % (ref 11.5–15.5)
WBC: 13.9 10*3/uL — ABNORMAL HIGH (ref 4.0–10.5)

## 2014-04-18 LAB — COMPREHENSIVE METABOLIC PANEL
ALK PHOS: 71 U/L (ref 39–117)
ALT: 28 U/L (ref 0–35)
ANION GAP: 13 (ref 5–15)
AST: 35 U/L (ref 0–37)
Albumin: 4.1 g/dL (ref 3.5–5.2)
BUN: 14 mg/dL (ref 6–23)
CHLORIDE: 105 mmol/L (ref 96–112)
CO2: 21 mmol/L (ref 19–32)
CREATININE: 0.64 mg/dL (ref 0.50–1.10)
Calcium: 9.3 mg/dL (ref 8.4–10.5)
GFR calc Af Amer: >90 mL/min (ref >90)
GFR calc non Af Amer: >90 mL/min (ref >90)
GLUCOSE: 84 mg/dL (ref 70–99)
POTASSIUM: 4.4 mmol/L (ref 3.5–5.1)
Sodium: 139 mmol/L (ref 135–145)
Total Bilirubin: 0.7 mg/dL (ref 0.3–1.2)
Total Protein: 8.8 g/dL — ABNORMAL HIGH (ref 6.0–8.3)

## 2014-04-18 LAB — URIC ACID: Uric Acid, Serum: 5.2 mg/dL (ref 2.4–7.0)

## 2014-04-18 MED ORDER — LACTATED RINGERS IV SOLN
Freq: Once | INTRAVENOUS | Status: AC
  Administered 2014-04-18: via INTRAVENOUS
  Filled 2014-04-18: qty 1000

## 2014-04-18 MED ORDER — METHYLDOPA 250 MG PO TABS
250.0000 mg | ORAL_TABLET | Freq: Once | ORAL | Status: AC
  Administered 2014-04-18: 250 mg via ORAL
  Filled 2014-04-18: qty 1

## 2014-04-18 MED ORDER — ACETAMINOPHEN 500 MG PO TABS
1000.0000 mg | ORAL_TABLET | ORAL | Status: AC
  Administered 2014-04-18: 1000 mg via ORAL
  Filled 2014-04-18: qty 2

## 2014-04-18 NOTE — MAU Note (Signed)
Pt was in NICU visiting son and started feeling shakiness and numbness in legs.  She came down to MAU from the NICU.

## 2014-04-19 ENCOUNTER — Encounter (HOSPITAL_COMMUNITY): Payer: Self-pay | Admitting: *Deleted

## 2014-04-19 DIAGNOSIS — Z1611 Resistance to penicillins: Secondary | ICD-10-CM | POA: Diagnosis present

## 2014-04-19 DIAGNOSIS — O864 Pyrexia of unknown origin following delivery: Secondary | ICD-10-CM | POA: Diagnosis present

## 2014-04-19 DIAGNOSIS — N10 Acute tubulo-interstitial nephritis: Secondary | ICD-10-CM | POA: Diagnosis present

## 2014-04-19 DIAGNOSIS — B9620 Unspecified Escherichia coli [E. coli] as the cause of diseases classified elsewhere: Secondary | ICD-10-CM | POA: Diagnosis present

## 2014-04-19 DIAGNOSIS — Z8632 Personal history of gestational diabetes: Secondary | ICD-10-CM | POA: Diagnosis not present

## 2014-04-19 DIAGNOSIS — N12 Tubulo-interstitial nephritis, not specified as acute or chronic: Secondary | ICD-10-CM | POA: Diagnosis present

## 2014-04-19 DIAGNOSIS — O1003 Pre-existing essential hypertension complicating the puerperium: Secondary | ICD-10-CM | POA: Diagnosis present

## 2014-04-19 DIAGNOSIS — O8621 Infection of kidney following delivery: Secondary | ICD-10-CM | POA: Diagnosis present

## 2014-04-19 LAB — CBC WITH DIFFERENTIAL/PLATELET
BASOS ABS: 0 10*3/uL (ref 0.0–0.1)
Basophils Relative: 0 % (ref 0–1)
EOS ABS: 0 10*3/uL (ref 0.0–0.7)
EOS PCT: 0 % (ref 0–5)
HEMATOCRIT: 31.8 % — AB (ref 36.0–46.0)
HEMOGLOBIN: 10.6 g/dL — AB (ref 12.0–15.0)
LYMPHS PCT: 8 % — AB (ref 12–46)
Lymphs Abs: 1.1 10*3/uL (ref 0.7–4.0)
MCH: 30.2 pg (ref 26.0–34.0)
MCHC: 33.3 g/dL (ref 30.0–36.0)
MCV: 90.6 fL (ref 78.0–100.0)
Monocytes Absolute: 0.9 10*3/uL (ref 0.1–1.0)
Monocytes Relative: 6 % (ref 3–12)
NEUTROS ABS: 12.8 10*3/uL — AB (ref 1.7–7.7)
Neutrophils Relative %: 86 % — ABNORMAL HIGH (ref 43–77)
Platelets: 384 10*3/uL (ref 150–400)
RBC: 3.51 MIL/uL — AB (ref 3.87–5.11)
RDW: 12.7 % (ref 11.5–15.5)
WBC: 14.9 10*3/uL — ABNORMAL HIGH (ref 4.0–10.5)

## 2014-04-19 LAB — URINALYSIS, ROUTINE W REFLEX MICROSCOPIC
Bilirubin Urine: NEGATIVE
Glucose, UA: NEGATIVE mg/dL
KETONES UR: NEGATIVE mg/dL
NITRITE: POSITIVE — AB
PH: 6 (ref 5.0–8.0)
Protein, ur: NEGATIVE mg/dL
Specific Gravity, Urine: 1.02 (ref 1.005–1.030)
UROBILINOGEN UA: 0.2 mg/dL (ref 0.0–1.0)

## 2014-04-19 LAB — PROTEIN / CREATININE RATIO, URINE
Creatinine, Urine: 29 mg/dL
Protein Creatinine Ratio: 1.31 — ABNORMAL HIGH (ref 0.00–0.15)
Total Protein, Urine: 38 mg/dL

## 2014-04-19 LAB — URINE MICROSCOPIC-ADD ON

## 2014-04-19 MED ORDER — METHYLDOPA 250 MG PO TABS
250.0000 mg | ORAL_TABLET | Freq: Two times a day (BID) | ORAL | Status: DC
  Administered 2014-04-19 – 2014-04-20 (×4): 250 mg via ORAL
  Filled 2014-04-19 (×5): qty 1

## 2014-04-19 MED ORDER — LACTATED RINGERS IV SOLN
INTRAVENOUS | Status: DC
  Administered 2014-04-19 – 2014-04-21 (×4): via INTRAVENOUS

## 2014-04-19 MED ORDER — CEFAZOLIN SODIUM 1-5 GM-% IV SOLN
1.0000 g | Freq: Three times a day (TID) | INTRAVENOUS | Status: DC
  Administered 2014-04-19 (×3): 1 g via INTRAVENOUS
  Filled 2014-04-19 (×4): qty 50

## 2014-04-19 MED ORDER — ACETAMINOPHEN 500 MG PO TABS
1000.0000 mg | ORAL_TABLET | ORAL | Status: DC | PRN
  Administered 2014-04-19: 500 mg via ORAL
  Administered 2014-04-19 – 2014-04-22 (×6): 1000 mg via ORAL
  Filled 2014-04-19 (×8): qty 2

## 2014-04-19 MED ORDER — AMPICILLIN-SULBACTAM SODIUM 3 (2-1) G IJ SOLR
3.0000 g | Freq: Four times a day (QID) | INTRAMUSCULAR | Status: DC
  Administered 2014-04-19 – 2014-04-21 (×8): 3 g via INTRAVENOUS
  Filled 2014-04-19 (×10): qty 3

## 2014-04-19 MED ORDER — PRENATAL MULTIVITAMIN CH
1.0000 | ORAL_TABLET | Freq: Every day | ORAL | Status: DC
  Administered 2014-04-19 – 2014-04-21 (×3): 1 via ORAL
  Filled 2014-04-19 (×3): qty 1

## 2014-04-19 NOTE — Progress Notes (Addendum)
Patient resting comfortably--denies pain, but still "feels bad". No N/V through night. Baby stable in NICU. Also reports child sick at home with fever, rash, vomiting.  Filed Vitals:   04/19/14 0115 04/19/14 0130 04/19/14 0215 04/19/14 0529  BP: 129/66 126/64 133/76 103/46  Pulse: 121 122 116 92  Temp:   98 F (36.7 C) 98.9 F (37.2 C)  TempSrc:   Oral Oral  Resp:   18 18  Height:   5\' 4"  (1.626 m)   Weight:   155 lb (70.308 kg)   SpO2:   99% 97%   Tmax 103 at 0022 100.1 at 0034 98.4 at 0215 98.9 at 0529  Pulse range since MN:  116-134  Physical Exam: Chest clear Heart RRR without murmur, tachycardia Abd--soft, NT, no rebound or guarding Pelvic:  Uterus NT, approx 12-14 week size, lochia scant Ext DTR 1-2+, no clonus, negative Homan's  .  ceFAZolin (ANCEF) IV  1 g Intravenous 3 times per day  . prenatal multivitamin  1 tablet Oral Q1200   Received 1st dose of Ancef at 0241 Received single dose Aldomet at 2336--no on-going regimen ordered. (has been on 250 mg po BID at home). Received 1000 mg Tylenol at 2353  Results for orders placed or performed during the hospital encounter of 04/18/14 (from the past 24 hour(s))  CBC with Differential/Platelet     Status: Abnormal   Collection Time: 04/18/14 11:00 PM  Result Value Ref Range   WBC 13.9 (H) 4.0 - 10.5 K/uL   RBC 4.12 3.87 - 5.11 MIL/uL   Hemoglobin 12.4 12.0 - 15.0 g/dL   HCT 81.138.5 91.436.0 - 78.246.0 %   MCV 93.4 78.0 - 100.0 fL   MCH 30.1 26.0 - 34.0 pg   MCHC 32.2 30.0 - 36.0 g/dL   RDW 95.612.7 21.311.5 - 08.615.5 %   Platelets 472 (H) 150 - 400 K/uL   Neutrophils Relative % 78 (H) 43 - 77 %   Neutro Abs 10.9 (H) 1.7 - 7.7 K/uL   Lymphocytes Relative 18 12 - 46 %   Lymphs Abs 2.5 0.7 - 4.0 K/uL   Monocytes Relative 2 (L) 3 - 12 %   Monocytes Absolute 0.2 0.1 - 1.0 K/uL   Eosinophils Relative 2 0 - 5 %   Eosinophils Absolute 0.3 0.0 - 0.7 K/uL   Basophils Relative 0 0 - 1 %   Basophils Absolute 0.0 0.0 - 0.1 K/uL   Comprehensive metabolic panel     Status: Abnormal   Collection Time: 04/18/14 11:00 PM  Result Value Ref Range   Sodium 139 135 - 145 mmol/L   Potassium 4.4 3.5 - 5.1 mmol/L   Chloride 105 96 - 112 mmol/L   CO2 21 19 - 32 mmol/L   Glucose, Bld 84 70 - 99 mg/dL   BUN 14 6 - 23 mg/dL   Creatinine, Ser 5.780.64 0.50 - 1.10 mg/dL   Calcium 9.3 8.4 - 46.910.5 mg/dL   Total Protein 8.8 (H) 6.0 - 8.3 g/dL   Albumin 4.1 3.5 - 5.2 g/dL   AST 35 0 - 37 U/L   ALT 28 0 - 35 U/L   Alkaline Phosphatase 71 39 - 117 U/L   Total Bilirubin 0.7 0.3 - 1.2 mg/dL   GFR calc non Af Amer >90 >90 mL/min   GFR calc Af Amer >90 >90 mL/min   Anion gap 13 5 - 15  Uric acid     Status: None   Collection Time: 04/18/14  11:00 PM  Result Value Ref Range   Uric Acid, Serum 5.2 2.4 - 7.0 mg/dL  Urinalysis, Routine w reflex microscopic     Status: Abnormal   Collection Time: 04/19/14 12:30 AM  Result Value Ref Range   Color, Urine YELLOW YELLOW   APPearance CLEAR CLEAR   Specific Gravity, Urine 1.020 1.005 - 1.030   pH 6.0 5.0 - 8.0   Glucose, UA NEGATIVE NEGATIVE mg/dL   Hgb urine dipstick LARGE (A) NEGATIVE   Bilirubin Urine NEGATIVE NEGATIVE   Ketones, ur NEGATIVE NEGATIVE mg/dL   Protein, ur NEGATIVE NEGATIVE mg/dL   Urobilinogen, UA 0.2 0.0 - 1.0 mg/dL   Nitrite POSITIVE (A) NEGATIVE   Leukocytes, UA MODERATE (A) NEGATIVE  Protein / creatinine ratio, urine     Status: Abnormal   Collection Time: 04/19/14 12:30 AM  Result Value Ref Range   Creatinine, Urine 29.00 mg/dL   Total Protein, Urine 38 mg/dL   Protein Creatinine Ratio 1.31 (H) 0.00 - 0.15  Urine microscopic-add on     Status: Abnormal   Collection Time: 04/19/14 12:30 AM  Result Value Ref Range   Squamous Epithelial / LPF FEW (A) RARE   WBC, UA 11-20 <3 WBC/hpf   RBC / HPF 21-50 <3 RBC/hpf   Bacteria, UA MANY (A) RARE   Urine culture pending.  Assessment: 8 days s/p SVB at 35 weeks Presumptive pyelonephritis Chronic hypertension Hx  gestational diabetes  Plan: Continue current care Ancef 1 gm TID. Aldomet 250 mg po BID CBC/diff pending this am.  Dr. Sallye Ober will follow today.  Nigel Bridgeman, CNM 04/19/14 8:15a  I saw and examined patient at bedside and agree with above findings, assesment and plan.  Patient denies breast engorgement symptoms.  Continue with antibiotics until atleast 24 hrs afebrile.  Follow up on urine culture.  Dr. Sallye Ober.

## 2014-04-19 NOTE — MAU Provider Note (Signed)
History     CSN: [id number]  Arrival date and time: 04/18/14 2247   First Provider Initiated Contact with Patient 04/18/14 2326      Chief Complaint  Patient presents with  . Numbness    numbness in legs   HPI Andrea Lambert 39 y.o. [id number] postpartum (delivered by NSVD on 04/11/14) female presents to MAU with shivering.  She was visiting her son in NICU when the staff noticed she was not well.  On presentation here, her shivering was too severe to get adequate vitals.  She had no complaints except for headache, 8/10, in the bilat occiput.  She denies chest pain, leg pain, SOB, abdominal pain, dysuria, heavy vaginal bleeding.  She does continue with scant bleeding following delivery.  Her older son is home with a fever, rash and vomiting.   OB History    Gravida Para Term Preterm AB TAB SAB Ectopic Multiple Living   2 2  2      0 2      Past Medical History  Diagnosis Date  . Gestational diabetes mellitus, antepartum   . Hypertension     Past Surgical History  Procedure Laterality Date  . None      No family history on file.  History  Substance Use Topics  . Smoking status: Never Smoker   . Smokeless tobacco: Not on file  . Alcohol Use: No    Allergies: No Known Allergies  Prescriptions prior to admission  Medication Sig Dispense Refill Last Dose  . methyldopa (ALDOMET) 250 MG tablet Take 250 mg by mouth 2 (two) times daily.   04/18/2014 at Unknown time  . Prenatal Vit-Min-FA-Fish Oil ([employer] PRENATAL GUMMY PO) Take 2 each by mouth daily.   04/18/2014 at Unknown time  . ibuprofen (ADVIL,MOTRIN) 600 MG tablet Take 1 tablet (600 mg total) by mouth every 6 (six) hours as needed. 30 tablet 1     ROS Pertinent ROS in HPI  Physical Exam   Blood pressure 115/60, pulse 120, temperature 100.1 F (37.8 C), temperature source Oral, resp. rate 22, SpO2 96 %, unknown if currently breastfeeding.  Physical Exam  Constitutional: She is oriented to person, place, and time. She  appears well-developed and well-nourished. No distress.  HENT:  Head: Normocephalic and atraumatic.  Eyes: EOM are normal.  Neck: Normal range of motion.  Cardiovascular:  Tachycardic  Respiratory: Effort normal and breath sounds normal. No respiratory distress. She has no wheezes. She has no rales.  GI: Soft. She exhibits no distension. There is no tenderness.  Musculoskeletal: Normal range of motion.  Negative Homan's Sign  Neurological: She is alert and oriented to person, place, and time.  Skin: Skin is warm and dry.  Psychiatric: She has a normal mood and affect.   Results for orders placed or performed during the hospital encounter of 04/18/14 (from the past 24 hour(s))  CBC with Differential/Platelet     Status: Abnormal   Collection Time: 04/18/14 11:00 PM  Result Value Ref Range   WBC 13.9 (H) 4.0 - 10.5 K/uL   RBC 4.12 3.87 - 5.11 MIL/uL   Hemoglobin 12.4 12.0 - 15.0 g/dL   HCT 14.738.5 82.936.0 - 56.246.0 %   MCV 93.4 78.0 - 100.0 fL   MCH 30.1 26.0 - 34.0 pg   MCHC 32.2 30.0 - 36.0 g/dL   RDW 13.012.7 86.511.5 - 78.415.5 %   Platelets 472 (H) 150 - 400 K/uL   Neutrophils Relative % 78 (H) 43 - 77 %  Neutro Abs 10.9 (H) 1.7 - 7.7 K/uL   Lymphocytes Relative 18 12 - 46 %   Lymphs Abs 2.5 0.7 - 4.0 K/uL   Monocytes Relative 2 (L) 3 - 12 %   Monocytes Absolute 0.2 0.1 - 1.0 K/uL   Eosinophils Relative 2 0 - 5 %   Eosinophils Absolute 0.3 0.0 - 0.7 K/uL   Basophils Relative 0 0 - 1 %   Basophils Absolute 0.0 0.0 - 0.1 K/uL  Comprehensive metabolic panel     Status: Abnormal   Collection Time: 04/18/14 11:00 PM  Result Value Ref Range   Sodium 139 135 - 145 mmol/L   Potassium 4.4 3.5 - 5.1 mmol/L   Chloride 105 96 - 112 mmol/L   CO2 21 19 - 32 mmol/L   Glucose, Bld 84 70 - 99 mg/dL   BUN 14 6 - 23 mg/dL   Creatinine, Ser 4.09 0.50 - 1.10 mg/dL   Calcium 9.3 8.4 - 81.1 mg/dL   Total Protein 8.8 (H) 6.0 - 8.3 g/dL   Albumin 4.1 3.5 - 5.2 g/dL   AST 35 0 - 37 U/L   ALT 28 0 - 35 U/L    Alkaline Phosphatase 71 39 - 117 U/L   Total Bilirubin 0.7 0.3 - 1.2 mg/dL   GFR calc non Af Amer >90 >90 mL/min   GFR calc Af Amer >90 >90 mL/min   Anion gap 13 5 - 15  Uric acid     Status: None   Collection Time: 04/18/14 11:00 PM  Result Value Ref Range   Uric Acid, Serum 5.2 2.4 - 7.0 mg/dL  Urinalysis, Routine w reflex microscopic     Status: Abnormal   Collection Time: 04/19/14 12:30 AM  Result Value Ref Range   Color, Urine YELLOW YELLOW   APPearance CLEAR CLEAR   Specific Gravity, Urine 1.020 1.005 - 1.030   pH 6.0 5.0 - 8.0   Glucose, UA NEGATIVE NEGATIVE mg/dL   Hgb urine dipstick LARGE (A) NEGATIVE   Bilirubin Urine NEGATIVE NEGATIVE   Ketones, ur NEGATIVE NEGATIVE mg/dL   Protein, ur NEGATIVE NEGATIVE mg/dL   Urobilinogen, UA 0.2 0.0 - 1.0 mg/dL   Nitrite POSITIVE (A) NEGATIVE   Leukocytes, UA MODERATE (A) NEGATIVE  Protein / creatinine ratio, urine     Status: Abnormal   Collection Time: 04/19/14 12:30 AM  Result Value Ref Range   Creatinine, Urine 29.00 mg/dL   Total Protein, Urine 38 mg/dL   Protein Creatinine Ratio 1.31 (H) 0.00 - 0.15  Urine microscopic-add on     Status: Abnormal   Collection Time: 04/19/14 12:30 AM  Result Value Ref Range   Squamous Epithelial / LPF FEW (A) RARE   WBC, UA 11-20 <3 WBC/hpf   RBC / HPF 21-50 <3 RBC/hpf   Bacteria, UA MANY (A) RARE    MAU Course  Procedures  MDM Discussed with Dr. Su Hilt.  Labs ordered: CBC with diff, U/A, Urine cx, Protein/Creatinine Ratio, CMP, uric acid.  Pt to also receive additional dose of Methyldopa , fluids.  Temp was rechecked and found to be 103.  Tylenol ordered.   Pt notes feeling much better after fluids, Tyl, methyldopa.  Temp improved at 100.1.  Able to give urine sample. Pt again discussed with Dr. Su Hilt.  Due to fever, U/A she directs to admit pt for pyelo to 3rd floor with Ancef 1 gram IV q 12 hours, continuous IV fluids at 125, regular diet, recheck CBC w/ diff  in am.     Assessment and Plan  A:  1. Acute pyelonephritis    P: Admit per orders given.  MD to see pt in am.    Bertram Denver 04/19/2014, 12:47 AM

## 2014-04-19 NOTE — Progress Notes (Signed)
Temp 101 at 18:00. Will d/c Ancef and begin Unasyn 3 gm IV every 6 hrs per Dr. Sallye OberKulwa. Will obtain CBC w/ diff in a.m.  Sherre ScarletKimberly Tremain Rucinski, CNM 04/19/2014, 07:17 PM

## 2014-04-19 NOTE — MAU Note (Signed)
Report called to 3rd floor, pt to go to room 305

## 2014-04-19 NOTE — Progress Notes (Signed)
Horald ChestnutVicky Latham, CNM notified of patients temperature of 101.0.  No orders received at this time.  Will continue to monitor.

## 2014-04-20 LAB — CBC WITH DIFFERENTIAL/PLATELET
BASOS ABS: 0 10*3/uL (ref 0.0–0.1)
BASOS PCT: 0 % (ref 0–1)
Eosinophils Absolute: 0.1 10*3/uL (ref 0.0–0.7)
Eosinophils Relative: 1 % (ref 0–5)
HEMATOCRIT: 30.5 % — AB (ref 36.0–46.0)
HEMOGLOBIN: 10.2 g/dL — AB (ref 12.0–15.0)
Lymphocytes Relative: 11 % — ABNORMAL LOW (ref 12–46)
Lymphs Abs: 1.2 10*3/uL (ref 0.7–4.0)
MCH: 30.3 pg (ref 26.0–34.0)
MCHC: 33.4 g/dL (ref 30.0–36.0)
MCV: 90.5 fL (ref 78.0–100.0)
MONO ABS: 0.8 10*3/uL (ref 0.1–1.0)
Monocytes Relative: 8 % (ref 3–12)
Neutro Abs: 8.3 10*3/uL — ABNORMAL HIGH (ref 1.7–7.7)
Neutrophils Relative %: 80 % — ABNORMAL HIGH (ref 43–77)
Platelets: 332 10*3/uL (ref 150–400)
RBC: 3.37 MIL/uL — ABNORMAL LOW (ref 3.87–5.11)
RDW: 12.9 % (ref 11.5–15.5)
WBC: 10.4 10*3/uL (ref 4.0–10.5)

## 2014-04-20 NOTE — Progress Notes (Signed)
Dr. Su Hiltoberts made aware of patients temperature of 102.3 and preliminary urine culture results.  No new order changes at this time and will continue to monitor.

## 2014-04-20 NOTE — Progress Notes (Signed)
Post Partum Day s/p delivery on 1/28 readmitted with fever and elevated BPs with h/o CHTN on aldomet 250mg  BID and GDM diet controlled  Subjective: no complaints, up ad lib, voiding, tolerating PO and hoping she can go home tomorrow because the baby is going to be discharged.  Objective: Blood pressure 133/74, pulse 99, temperature 98.6 F (37 C), temperature source Oral, resp. rate 18, height 5\' 4"  (1.626 m), weight 70.308 kg (155 lb), SpO2 98 %, unknown if currently breastfeeding.  Physical Exam:  General: alert and no distress Lochia: scant Uterine Fundus: firm, NT DVT Evaluation: No evidence of DVT seen on physical exam.   Recent Labs  04/19/14 0757 04/20/14 0520  HGB 10.6* 10.2*  HCT 31.8* 30.5*    Assessment/Plan: Breastfeeding and Lactation consult  Pt doing well but with fever/Tmax 101 at 6pm Continue antibiotics, currently on unasyn May d/c home when afebrile for 24hrs Urine Cx pending   LOS: 2 days   Clydia Nieves Y 04/20/2014, 8:17 AM

## 2014-04-20 NOTE — Lactation Note (Signed)
This note was copied from the chart of Boy Riki RuskMaria Edmundson. Lactation Consultation Note: Follow up visit with this readmitted mom. She is pumping but did not pump for about 8 hours during the night. Reports she is leaking a lot. Encouraged to pump q 3 hours to promote a good milk supply. Is not going home today- on antibiotics and still has fever. Has pump at home. Asking about putting baby to the breast. Encouraged to ask in NICU when baby is ready to latch and to ask for assist when baby is ready. No further questions at present. To call prn   Patient Name: Boy Riki RuskMaria Snedeker ZOXWR'UToday's Date: 04/20/2014     Maternal Data    Feeding Feeding Type: Breast Milk Nipple Type: Slow - flow  LATCH Score/Interventions                      Lactation Tools Discussed/Used     Consult Status      Pamelia HoitWeeks, Crystalann Korf D 04/20/2014, 8:42 AM

## 2014-04-21 LAB — COMPREHENSIVE METABOLIC PANEL
ALT: 23 U/L (ref 0–35)
ANION GAP: 4 — AB (ref 5–15)
AST: 29 U/L (ref 0–37)
Albumin: 2.7 g/dL — ABNORMAL LOW (ref 3.5–5.2)
Alkaline Phosphatase: 49 U/L (ref 39–117)
BUN: 9 mg/dL (ref 6–23)
CHLORIDE: 106 mmol/L (ref 96–112)
CO2: 28 mmol/L (ref 19–32)
CREATININE: 0.51 mg/dL (ref 0.50–1.10)
Calcium: 8.2 mg/dL — ABNORMAL LOW (ref 8.4–10.5)
GFR calc Af Amer: >90 mL/min (ref >90)
Glucose, Bld: 100 mg/dL — ABNORMAL HIGH (ref 70–99)
Potassium: 3.4 mmol/L — ABNORMAL LOW (ref 3.5–5.1)
SODIUM: 138 mmol/L (ref 135–145)
Total Bilirubin: 0.4 mg/dL (ref 0.3–1.2)
Total Protein: 6.1 g/dL (ref 6.0–8.3)

## 2014-04-21 LAB — CBC WITH DIFFERENTIAL/PLATELET
BASOS ABS: 0 10*3/uL (ref 0.0–0.1)
Basophils Relative: 0 % (ref 0–1)
EOS PCT: 1 % (ref 0–5)
Eosinophils Absolute: 0.1 10*3/uL (ref 0.0–0.7)
HCT: 31.6 % — ABNORMAL LOW (ref 36.0–46.0)
Hemoglobin: 10.4 g/dL — ABNORMAL LOW (ref 12.0–15.0)
LYMPHS PCT: 19 % (ref 12–46)
Lymphs Abs: 1.3 10*3/uL (ref 0.7–4.0)
MCH: 29.7 pg (ref 26.0–34.0)
MCHC: 32.9 g/dL (ref 30.0–36.0)
MCV: 90.3 fL (ref 78.0–100.0)
MONO ABS: 0.6 10*3/uL (ref 0.1–1.0)
Monocytes Relative: 10 % (ref 3–12)
NEUTROS PCT: 70 % (ref 43–77)
Neutro Abs: 4.8 10*3/uL (ref 1.7–7.7)
PLATELETS: 319 10*3/uL (ref 150–400)
RBC: 3.5 MIL/uL — AB (ref 3.87–5.11)
RDW: 13 % (ref 11.5–15.5)
WBC: 6.8 10*3/uL (ref 4.0–10.5)

## 2014-04-21 LAB — CULTURE, OB URINE: Colony Count: >=100000

## 2014-04-21 LAB — LACTATE DEHYDROGENASE: LDH: 143 U/L (ref 94–250)

## 2014-04-21 LAB — URIC ACID: URIC ACID, SERUM: 3.9 mg/dL (ref 2.4–7.0)

## 2014-04-21 MED ORDER — CEFTRIAXONE SODIUM IN DEXTROSE 20 MG/ML IV SOLN
1.0000 g | INTRAVENOUS | Status: DC
  Administered 2014-04-21: 1 g via INTRAVENOUS
  Filled 2014-04-21: qty 50

## 2014-04-21 MED ORDER — METHYLDOPA 250 MG PO TABS
250.0000 mg | ORAL_TABLET | Freq: Three times a day (TID) | ORAL | Status: DC
  Administered 2014-04-21 – 2014-04-22 (×4): 250 mg via ORAL
  Filled 2014-04-21 (×4): qty 1

## 2014-04-21 NOTE — Progress Notes (Signed)
HD#3 Postpartum Note  Patient resting comfortably in bed, reports no acute complaints.  States that her back pain and chills have now resolved.  Tolerating general diet.  No CP/SOB/headache/dizziness/change in vision.  Tolerating general diet.  +Flatus, +BMs.  Breastpump at bedside- baby being discharged from NICU today.   Objective:  BP 157/76 mmHg  Pulse 75  Temp(Src) 98.6 F (37 C) (Oral)  Resp 18  Ht 5\' 4"  (1.626 m)  Wt 70.308 kg (155 lb)  BMI 26.59 kg/m2  SpO2 98%  BP range: 133-165/65-89 Last temp: 100.6 @ 2259  Physical Exam:  General: alert and no distress  CV: RRR Lungs: CTAB Lochia: pt reports minimal bleeding Uterine Fundus: firm, NT, below umbilicus No CVA tenderness DVT Evaluation: No evidence of DVT seen on physical exam.  No edema bilaterally  CBC Latest Ref Rng 04/21/2014 04/20/2014 04/19/2014  WBC 4.0 - 10.5 K/uL 6.8 10.4 14.9(H)  Hemoglobin 12.0 - 15.0 g/dL 10.4(L) 10.2(L) 10.6(L)  Hematocrit 36.0 - 46.0 % 31.6(L) 30.5(L) 31.8(L)  Platelets 150 - 400 K/uL 319 332 384    Urine culture: E. Coli- final culture with sensitivity pending   Assessment/Plan: 38yo [id number] readmitted for pyelonephritis -Final urine culture pending -Currently on IV Unasyn and will continue IV therapy until at least 24hr afebrile, last temp 2/7 @ 2259 -WBC improving and clinically doing better -continue LR @ 125cc/hr -Tylenol prn pain and fever -Chronic HTN: BPs noted to be elevated, will change to methyldopa tid -Continue general diet and routine postpartum care  Myna HidalgoOZAN, Anikah Hogge, M 04/21/2014, 7:03 AM

## 2014-04-21 NOTE — Progress Notes (Signed)
Ur chart review completed.  

## 2014-04-21 NOTE — Progress Notes (Signed)
In to assess patient.. Her urine culture is resistant to ampicillin will therefore discontinue unasyn and start rocephin... Patient reports headache earlier. No back pain  Filed Vitals:   04/21/14 1702  BP: 153/75  Pulse: 79  Temp: 100.3 F (37.9 C)  Resp: 18   General A&O no acute distress  Abdomen soft nontender no CVA tenderness  Ext no edema   Results for orders placed or performed during the hospital encounter of 04/18/14 (from the past 48 hour(s))  CBC with Differential/Platelet     Status: Abnormal   Collection Time: 04/20/14  5:20 AM  Result Value Ref Range   WBC 10.4 4.0 - 10.5 K/uL   RBC 3.37 (L) 3.87 - 5.11 MIL/uL   Hemoglobin 10.2 (L) 12.0 - 15.0 g/dL   HCT 30.5 (L) 36.0 - 46.0 %   MCV 90.5 78.0 - 100.0 fL   MCH 30.3 26.0 - 34.0 pg   MCHC 33.4 30.0 - 36.0 g/dL   RDW 12.9 11.5 - 15.5 %   Platelets 332 150 - 400 K/uL   Neutrophils Relative % 80 (H) 43 - 77 %   Neutro Abs 8.3 (H) 1.7 - 7.7 K/uL   Lymphocytes Relative 11 (L) 12 - 46 %   Lymphs Abs 1.2 0.7 - 4.0 K/uL   Monocytes Relative 8 3 - 12 %   Monocytes Absolute 0.8 0.1 - 1.0 K/uL   Eosinophils Relative 1 0 - 5 %   Eosinophils Absolute 0.1 0.0 - 0.7 K/uL   Basophils Relative 0 0 - 1 %   Basophils Absolute 0.0 0.0 - 0.1 K/uL  CBC with Differential/Platelet     Status: Abnormal   Collection Time: 04/21/14  5:20 AM  Result Value Ref Range   WBC 6.8 4.0 - 10.5 K/uL   RBC 3.50 (L) 3.87 - 5.11 MIL/uL   Hemoglobin 10.4 (L) 12.0 - 15.0 g/dL   HCT 31.6 (L) 36.0 - 46.0 %   MCV 90.3 78.0 - 100.0 fL   MCH 29.7 26.0 - 34.0 pg   MCHC 32.9 30.0 - 36.0 g/dL   RDW 13.0 11.5 - 15.5 %   Platelets 319 150 - 400 K/uL   Neutrophils Relative % 70 43 - 77 %   Neutro Abs 4.8 1.7 - 7.7 K/uL   Lymphocytes Relative 19 12 - 46 %   Lymphs Abs 1.3 0.7 - 4.0 K/uL   Monocytes Relative 10 3 - 12 %   Monocytes Absolute 0.6 0.1 - 1.0 K/uL   Eosinophils Relative 1 0 - 5 %   Eosinophils Absolute 0.1 0.0 - 0.7 K/uL   Basophils  Relative 0 0 - 1 %   Basophils Absolute 0.0 0.0 - 0.1 K/uL  Comprehensive metabolic panel     Status: Abnormal   Collection Time: 04/21/14  5:20 AM  Result Value Ref Range   Sodium 138 135 - 145 mmol/L   Potassium 3.4 (L) 3.5 - 5.1 mmol/L   Chloride 106 96 - 112 mmol/L   CO2 28 19 - 32 mmol/L   Glucose, Bld 100 (H) 70 - 99 mg/dL   BUN 9 6 - 23 mg/dL   Creatinine, Ser 0.51 0.50 - 1.10 mg/dL   Calcium 8.2 (L) 8.4 - 10.5 mg/dL   Total Protein 6.1 6.0 - 8.3 g/dL   Albumin 2.7 (L) 3.5 - 5.2 g/dL   AST 29 0 - 37 U/L   ALT 23 0 - 35 U/L   Alkaline Phosphatase 49 39 - 117  U/L   Total Bilirubin 0.4 0.3 - 1.2 mg/dL   GFR calc non Af Amer >90 >90 mL/min   GFR calc Af Amer >90 >90 mL/min    Comment: (NOTE) The eGFR has been calculated using the CKD EPI equation. This calculation has not been validated in all clinical situations. eGFR's persistently <90 mL/min signify possible Chronic Kidney Disease.    Anion gap 4 (L) 5 - 15  Uric acid     Status: None   Collection Time: 04/21/14  5:20 AM  Result Value Ref Range   Uric Acid, Serum 3.9 2.4 - 7.0 mg/dL  Lactate dehydrogenase     Status: None   Collection Time: 04/21/14  5:20 AM  Result Value Ref Range   LDH 143 94 - 250 U/L     A/P Pyelonephritis. Urine culture resistant to ampicillin. Will d/c unasyn start rocephin.  Plan discharge home tomorrow if she remains afebrile.  HTN methyldopa 250 mg tid

## 2014-04-22 ENCOUNTER — Inpatient Hospital Stay (HOSPITAL_COMMUNITY)
Admission: AD | Admit: 2014-04-22 | Discharge: 2014-04-23 | Disposition: A | Discharge status: Home / Self Care | Payer: BLUE CROSS/BLUE SHIELD | Source: Ambulatory Visit | Attending: Obstetrics and Gynecology | Admitting: Obstetrics and Gynecology

## 2014-04-22 DIAGNOSIS — L299 Pruritus, unspecified: Secondary | ICD-10-CM | POA: Insufficient documentation

## 2014-04-22 DIAGNOSIS — T361X5A Adverse effect of cephalosporins and other beta-lactam antibiotics, initial encounter: Secondary | ICD-10-CM | POA: Insufficient documentation

## 2014-04-22 DIAGNOSIS — T7840XA Allergy, unspecified, initial encounter: Secondary | ICD-10-CM

## 2014-04-22 MED ORDER — METHYLDOPA 250 MG PO TABS: 250.0000 mg | ORAL_TABLET | Freq: Three times a day (TID) | ORAL | Status: AC

## 2014-04-22 MED ORDER — CEPHALEXIN 500 MG PO CAPS: 500.0000 mg | ORAL_CAPSULE | Freq: Two times a day (BID) | ORAL | Status: AC

## 2014-04-22 NOTE — Discharge Summary (Signed)
Physician Discharge Summary  Patient ID: Andrea Lambert MRN: [id number] DOB/AGE: 40/04/1975 [age] y.o.  Admit date: 04/18/2014 Discharge date: 04/22/2014  Admission Diagnoses: 1 pyelonephritis 2 chronic hypertension   Discharge Diagnoses:  Active Problems:   Pyelonephritis   Discharged Condition: stable  Hospital Course: pt was admitted on 04/18/2014 with fever chills and back pain . She was diagnosed with pyelonephritis and initially treated with ancef. She continued to have fever and was switched to unasyn. Her urine culture revealed E. Coli that was resistant to ampicillin. She was then switched to ceftriaxone. She was afebrile for over 24 hours prior to discharge.   Consults: None  Significant Diagnostic Studies: labs: urine culture resistant to ampicillin  Treatments: IV hydration and antibiotics: ceftriaxone  Discharge Exam: Blood pressure 142/75, pulse 77, temperature 98.5 F (36.9 C), temperature source Oral, resp. rate 18, height 5\' 4"  (1.626 m), weight 70.308 kg (155 lb), SpO2 99 %, unknown if currently breastfeeding. General appearance: alert and cooperative  Disposition: 01-Home or Self Care  Discharge Instructions    Activity as tolerated - No restrictions    Complete by:  As directed      Call MD for:  difficulty breathing, headache or visual disturbances    Complete by:  As directed      Call MD for:  persistant nausea and vomiting    Complete by:  As directed      Call MD for:  severe uncontrolled pain    Complete by:  As directed      Call MD for:  temperature >100.4    Complete by:  As directed      Diet - low sodium heart healthy    Complete by:  As directed      May shower / Bathe    Complete by:  As directed      May walk up steps    Complete by:  As directed      Sexual Activity Restrictions    Complete by:  As directed   Avoid sex for 5 weeks            Medication List    TAKE these medications        cephALEXin 500 MG capsule  Commonly  known as:  KEFLEX  Take 1 capsule (500 mg total) by mouth 2 (two) times daily.     [employer] PRENATAL GUMMY PO  Take 2 each by mouth daily.     ibuprofen 600 MG tablet  Commonly known as:  ADVIL,MOTRIN  Take 1 tablet (600 mg total) by mouth every 6 (six) hours as needed.     methyldopa 250 MG tablet  Commonly known as:  ALDOMET  Take 1 tablet (250 mg total) by mouth every 8 (eight) hours.           Follow-up Information    Follow up with Jessee AversOLE,Paublo Warshawsky J., MD. Schedule an appointment as soon as possible for a visit in 1 week.   Specialty:  Obstetrics and Gynecology   Why:  please schedule visit for bp check in 1 wk with Dr. Cathe Monsole    Contact information:   [address] [place] [address] [phone]       Signed: Jessee AversCOLE,Andrea Kinney J. 04/22/2014, 8:29 AM

## 2014-04-22 NOTE — MAU Note (Signed)
[facility] from [facility]. Had SVD 1/28. Took cephalexin today and has been itching since. Denies any other symptoms.

## 2014-04-22 NOTE — Progress Notes (Signed)
Pt verbalized understanding of d/c instructions, medications, follow up appts and belongings policy. No questions at time of d/c. Pt was provided with all of her pumped breast milk and encouraged to check room thoroughly for belongings. Pt was escorted to the main entrance by NT. Pts family member is driving her home. Sheryn BisonGordon, [place] Scheidegger Warner

## 2014-04-22 NOTE — Progress Notes (Signed)
Subjective: Patient reports tolerating PO, + flatus and no problems voiding.  Headache and back pain have resolved   Objective: I have reviewed patient's vital signs, intake and output, medications and labs. Filed Vitals:   04/22/14 0526  BP: 142/75  Pulse: 77  Temp: 98.5 F (36.9 C)  Resp: 18    General: alert and cooperative GI: soft, non-tender; bowel sounds normal; no masses,  no organomegaly Extremities: extremities normal, atraumatic, no cyanosis or edema No CVA tenderness   Assessment/Plan: Pyelonephritis improving with rocephin discharge home on keflex 500 mg 1 po bid x 7 days  CHTN continue aldomet 250 mg tid  Pt to follow up in 1 wk for bp check    LOS: 4 days    Rayetta Veith J. 04/22/2014, 8:23 AM

## 2014-04-23 ENCOUNTER — Encounter (HOSPITAL_COMMUNITY): Payer: Self-pay | Admitting: *Deleted

## 2014-04-23 DIAGNOSIS — T361X5A Adverse effect of cephalosporins and other beta-lactam antibiotics, initial encounter: Secondary | ICD-10-CM

## 2014-04-23 DIAGNOSIS — L299 Pruritus, unspecified: Secondary | ICD-10-CM | POA: Diagnosis not present

## 2014-04-23 MED ORDER — FAMOTIDINE 20 MG PO TABS
ORAL_TABLET | ORAL | Status: AC
  Administered 2014-04-23: 20 mg via ORAL
  Filled 2014-04-23: qty 1

## 2014-04-23 MED ORDER — LORATADINE 10 MG PO TABS: 10.0000 mg | ORAL_TABLET | Freq: Every day | ORAL | Status: DC

## 2014-04-23 NOTE — MAU Note (Signed)
Pt started medication 2100 and started having itching hives within  30mins on face and spread to the rest of her body

## 2014-04-23 NOTE — Progress Notes (Signed)
itching

## 2014-04-23 NOTE — MAU Provider Note (Signed)
  History     CSN: [id number]  Arrival date and time: 04/22/14 2337   First Provider Initiated Contact with Patient 04/23/14 0013      Chief Complaint  Patient presents with  . Allergic Reaction   HPI  Andrea Lambert is a 39 y.o. [id number] who presents today after having an allergic reaction to the cephalexin she was given. She states that she started it today, and she took a dose at 2100, and she started to feel itchy and see blotches on her face. She states that her husband gave her a pediatric dose of zyrtec. She states that even that small dose helped some. She denies any difficulty breathing.   Past Medical History  Diagnosis Date  . Gestational diabetes mellitus, antepartum   . Hypertension     Past Surgical History  Procedure Laterality Date  . None      No family history on file.  History  Substance Use Topics  . Smoking status: Never Smoker   . Smokeless tobacco: Not on file  . Alcohol Use: No    Allergies: No Known Allergies  Prescriptions prior to admission  Medication Sig Dispense Refill Last Dose  . cephALEXin (KEFLEX) 500 MG capsule Take 1 capsule (500 mg total) by mouth 2 (two) times daily. 14 capsule 0   . ibuprofen (ADVIL,MOTRIN) 600 MG tablet Take 1 tablet (600 mg total) by mouth every 6 (six) hours as needed. 30 tablet 1   . methyldopa (ALDOMET) 250 MG tablet Take 1 tablet (250 mg total) by mouth every 8 (eight) hours. 90 tablet 1   . Prenatal Vit-Min-FA-Fish Oil ([employer] PRENATAL GUMMY PO) Take 2 each by mouth daily.   04/18/2014 at Unknown time    ROS Physical Exam   Blood pressure 169/107, pulse 93, temperature 98.7 F (37.1 C), resp. rate 18, height 5' 4.5" (1.638 m), weight 65.318 kg (144 lb), SpO2 99 %, currently breastfeeding.  Physical Exam  Nursing note and vitals reviewed. Constitutional: She is oriented to person, place, and time. She appears well-developed and well-nourished. No distress.  Cardiovascular: Normal rate.   Respiratory:  Effort normal. No respiratory distress. She has no wheezes. She has no rales.  GI: Soft. There is no tenderness.  Neurological: She is alert and oriented to person, place, and time.  Skin: Skin is warm and dry.  Scattered hives on legs, back and face   Psychiatric: She has a normal mood and affect.   Patient is on methyldopa for BP. She has not taken her night time dose. She took her nighttime dose while here and BP was normal prior to DC  MAU Course  Procedures  No results found for this or any previous visit (from the past 24 hour(s)).  0101: D/W Dr. Normand Sloopillard will change abx to Bactrim DS BID x 5 days. Benadryl PRN   Assessment and Plan   1. Allergic reaction caused by a drug    Bactrim DS #5 0 RF Benadryl PRN Ad[facility] if any difficulty [facility] occurs  Follow-up Information    Follow up with Jessee AversOLE,TARA J., MD.   Specialty:  Obstetrics and Gynecology   Why:  As scheduled   Contact information:   [address] [place] [address] [phone]        Tawnya CrookHogan, Ieisha Gao Donovan 04/23/2014, 12:15 AM

## 2014-04-23 NOTE — MAU Note (Signed)
Pt provided with breast pump after pt pt complaining that breast were leaking

## 2014-12-16 ENCOUNTER — Other Ambulatory Visit: Payer: Self-pay

## 2014-12-16 DIAGNOSIS — Z1231 Encounter for screening mammogram for malignant neoplasm of breast: Secondary | ICD-10-CM

## 2014-12-29 ENCOUNTER — Ambulatory Visit
Admission: RE | Admit: 2014-12-29 | Discharge: 2014-12-29 | Disposition: A | Discharge status: Home / Self Care | Payer: BLUE CROSS/BLUE SHIELD | Source: Ambulatory Visit

## 2014-12-29 DIAGNOSIS — Z1231 Encounter for screening mammogram for malignant neoplasm of breast: Secondary | ICD-10-CM

## 2015-03-04 ENCOUNTER — Other Ambulatory Visit: Payer: Self-pay | Admitting: Family Medicine

## 2015-03-04 ENCOUNTER — Other Ambulatory Visit (HOSPITAL_COMMUNITY)
Admission: RE | Admit: 2015-03-04 | Discharge: 2015-03-04 | Disposition: A | Discharge status: Home / Self Care | Payer: BLUE CROSS/BLUE SHIELD | Source: Ambulatory Visit | Attending: Family Medicine | Admitting: Family Medicine

## 2015-03-04 DIAGNOSIS — Z01411 Encounter for gynecological examination (general) (routine) with abnormal findings: Secondary | ICD-10-CM | POA: Diagnosis present

## 2015-03-10 LAB — CYTOLOGY - PAP

## 2015-09-03 DIAGNOSIS — I10 Essential (primary) hypertension: Secondary | ICD-10-CM | POA: Diagnosis not present

## 2015-09-03 DIAGNOSIS — R7303 Prediabetes: Secondary | ICD-10-CM | POA: Diagnosis not present

## 2015-12-16 DIAGNOSIS — Z23 Encounter for immunization: Secondary | ICD-10-CM | POA: Diagnosis not present

## 2016-03-15 ENCOUNTER — Other Ambulatory Visit: Payer: Self-pay | Admitting: Family Medicine

## 2016-03-15 DIAGNOSIS — I10 Essential (primary) hypertension: Secondary | ICD-10-CM | POA: Diagnosis not present

## 2016-03-15 DIAGNOSIS — E559 Vitamin D deficiency, unspecified: Secondary | ICD-10-CM | POA: Diagnosis not present

## 2016-03-15 DIAGNOSIS — Z0000 Encounter for general adult medical examination without abnormal findings: Secondary | ICD-10-CM | POA: Diagnosis not present

## 2016-03-15 DIAGNOSIS — E8881 Metabolic syndrome: Secondary | ICD-10-CM | POA: Diagnosis not present

## 2016-03-15 DIAGNOSIS — Z1231 Encounter for screening mammogram for malignant neoplasm of breast: Secondary | ICD-10-CM

## 2016-03-17 ENCOUNTER — Ambulatory Visit
Admission: RE | Admit: 2016-03-17 | Discharge: 2016-03-17 | Disposition: A | Discharge status: Home / Self Care | Payer: BLUE CROSS/BLUE SHIELD | Source: Ambulatory Visit | Attending: Family Medicine | Admitting: Family Medicine

## 2016-03-17 DIAGNOSIS — Z1231 Encounter for screening mammogram for malignant neoplasm of breast: Secondary | ICD-10-CM

## 2016-07-05 DIAGNOSIS — E559 Vitamin D deficiency, unspecified: Secondary | ICD-10-CM | POA: Diagnosis not present

## 2017-03-15 ENCOUNTER — Other Ambulatory Visit: Payer: Self-pay | Admitting: Family Medicine

## 2017-03-15 DIAGNOSIS — Z1231 Encounter for screening mammogram for malignant neoplasm of breast: Secondary | ICD-10-CM

## 2017-03-24 ENCOUNTER — Ambulatory Visit
Admission: RE | Admit: 2017-03-24 | Discharge: 2017-03-24 | Disposition: A | Discharge status: Home / Self Care | Payer: BLUE CROSS/BLUE SHIELD | Source: Ambulatory Visit | Attending: Family Medicine | Admitting: Family Medicine

## 2017-03-24 DIAGNOSIS — Z1231 Encounter for screening mammogram for malignant neoplasm of breast: Secondary | ICD-10-CM | POA: Diagnosis not present

## 2017-03-30 ENCOUNTER — Other Ambulatory Visit: Payer: Self-pay | Admitting: Family Medicine

## 2017-03-30 ENCOUNTER — Other Ambulatory Visit (HOSPITAL_COMMUNITY)
Admission: RE | Admit: 2017-03-30 | Discharge: 2017-03-30 | Disposition: A | Discharge status: Home / Self Care | Payer: BLUE CROSS/BLUE SHIELD | Source: Ambulatory Visit | Attending: Family Medicine | Admitting: Family Medicine

## 2017-03-30 DIAGNOSIS — E559 Vitamin D deficiency, unspecified: Secondary | ICD-10-CM | POA: Diagnosis not present

## 2017-03-30 DIAGNOSIS — E8881 Metabolic syndrome: Secondary | ICD-10-CM | POA: Diagnosis not present

## 2017-03-30 DIAGNOSIS — Z01411 Encounter for gynecological examination (general) (routine) with abnormal findings: Secondary | ICD-10-CM | POA: Diagnosis not present

## 2017-03-30 DIAGNOSIS — Z124 Encounter for screening for malignant neoplasm of cervix: Secondary | ICD-10-CM | POA: Diagnosis not present

## 2017-03-30 DIAGNOSIS — Z0000 Encounter for general adult medical examination without abnormal findings: Secondary | ICD-10-CM | POA: Diagnosis not present

## 2017-04-04 LAB — CYTOLOGY - PAP
DIAGNOSIS: NEGATIVE
HPV: NOT DETECTED

## 2018-03-16 ENCOUNTER — Other Ambulatory Visit: Payer: Self-pay | Admitting: Family Medicine

## 2018-03-16 DIAGNOSIS — Z1231 Encounter for screening mammogram for malignant neoplasm of breast: Secondary | ICD-10-CM

## 2018-04-05 ENCOUNTER — Ambulatory Visit
Admission: RE | Admit: 2018-04-05 | Discharge: 2018-04-05 | Disposition: A | Discharge status: Home / Self Care | Payer: BLUE CROSS/BLUE SHIELD | Source: Ambulatory Visit | Attending: Family Medicine | Admitting: Family Medicine

## 2018-04-05 DIAGNOSIS — Z1231 Encounter for screening mammogram for malignant neoplasm of breast: Secondary | ICD-10-CM

## 2018-04-13 DIAGNOSIS — I10 Essential (primary) hypertension: Secondary | ICD-10-CM | POA: Diagnosis not present

## 2018-04-13 DIAGNOSIS — Z8632 Personal history of gestational diabetes: Secondary | ICD-10-CM | POA: Diagnosis not present

## 2018-04-13 DIAGNOSIS — E8881 Metabolic syndrome: Secondary | ICD-10-CM | POA: Diagnosis not present

## 2018-04-13 DIAGNOSIS — Z0000 Encounter for general adult medical examination without abnormal findings: Secondary | ICD-10-CM | POA: Diagnosis not present

## 2018-05-22 DIAGNOSIS — I10 Essential (primary) hypertension: Secondary | ICD-10-CM | POA: Diagnosis not present

## 2019-02-18 DIAGNOSIS — E8881 Metabolic syndrome: Secondary | ICD-10-CM | POA: Diagnosis not present

## 2019-02-18 DIAGNOSIS — E559 Vitamin D deficiency, unspecified: Secondary | ICD-10-CM | POA: Diagnosis not present

## 2019-02-18 DIAGNOSIS — I10 Essential (primary) hypertension: Secondary | ICD-10-CM | POA: Diagnosis not present

## 2019-02-27 ENCOUNTER — Other Ambulatory Visit: Payer: Self-pay | Admitting: Family Medicine

## 2019-02-27 DIAGNOSIS — Z1231 Encounter for screening mammogram for malignant neoplasm of breast: Secondary | ICD-10-CM

## 2019-04-16 ENCOUNTER — Other Ambulatory Visit: Payer: Self-pay

## 2019-04-16 ENCOUNTER — Ambulatory Visit
Admission: RE | Admit: 2019-04-16 | Discharge: 2019-04-16 | Disposition: A | Discharge status: Home / Self Care | Payer: BLUE CROSS/BLUE SHIELD | Source: Ambulatory Visit | Attending: Family Medicine | Admitting: Family Medicine

## 2019-04-16 DIAGNOSIS — Z1231 Encounter for screening mammogram for malignant neoplasm of breast: Secondary | ICD-10-CM

## 2020-03-17 ENCOUNTER — Other Ambulatory Visit: Payer: Self-pay | Admitting: Family Medicine

## 2020-03-17 DIAGNOSIS — Z1231 Encounter for screening mammogram for malignant neoplasm of breast: Secondary | ICD-10-CM

## 2020-03-18 IMAGING — MG DIGITAL SCREENING BILATERAL MAMMOGRAM WITH TOMO AND CAD
8 series · 9 of 24 positions shown · non-contrast
Comparison: Previous exam(s).

CLINICAL DATA: Screening.

EXAM:
DIGITAL SCREENING BILATERAL MAMMOGRAM WITH TOMO AND CAD

[R MLO synth-2D]
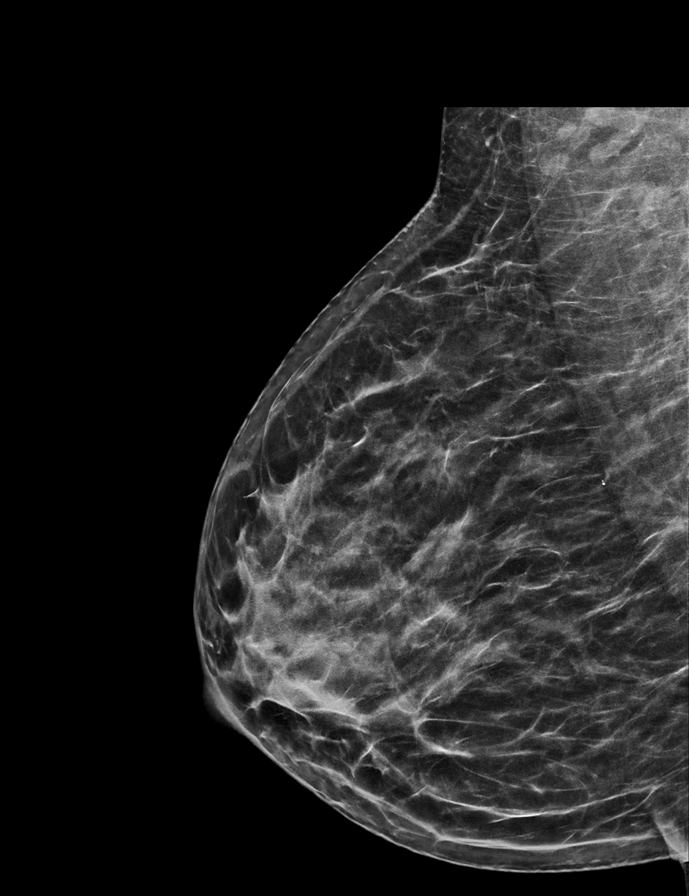

[R CC synth-2D]
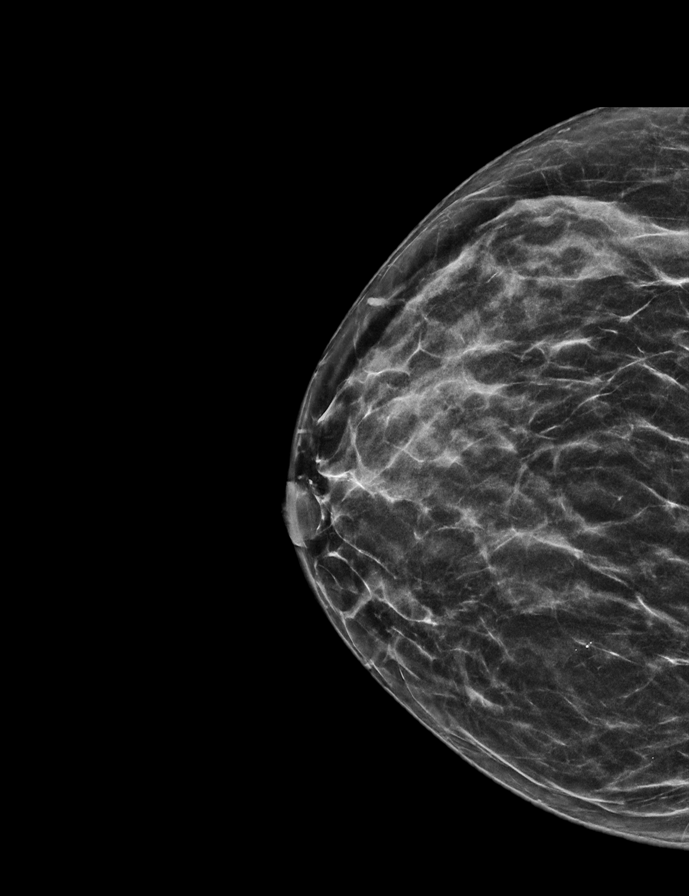

[L CC synth-2D]
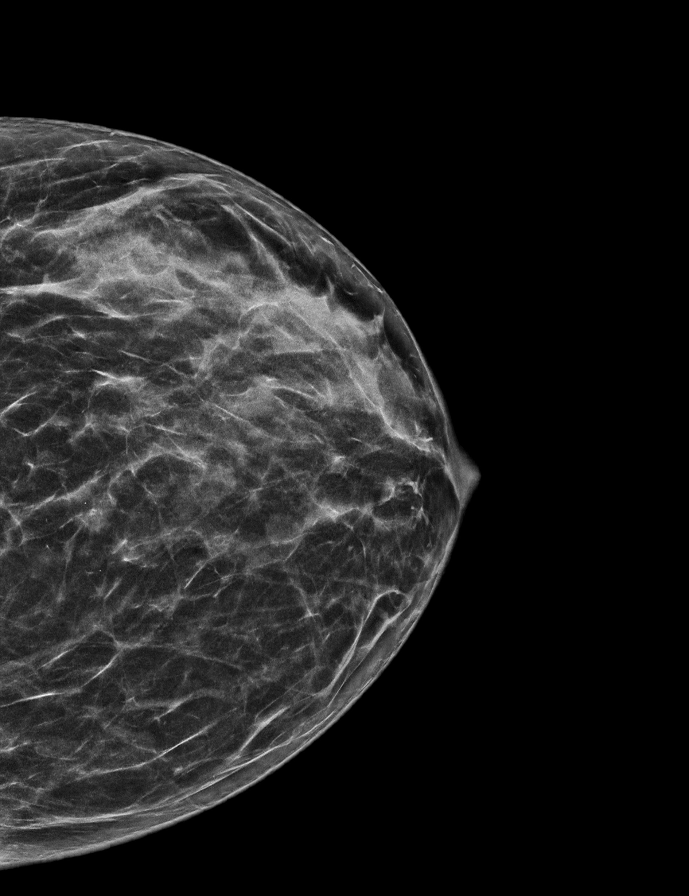

[L MLO synth-2D]
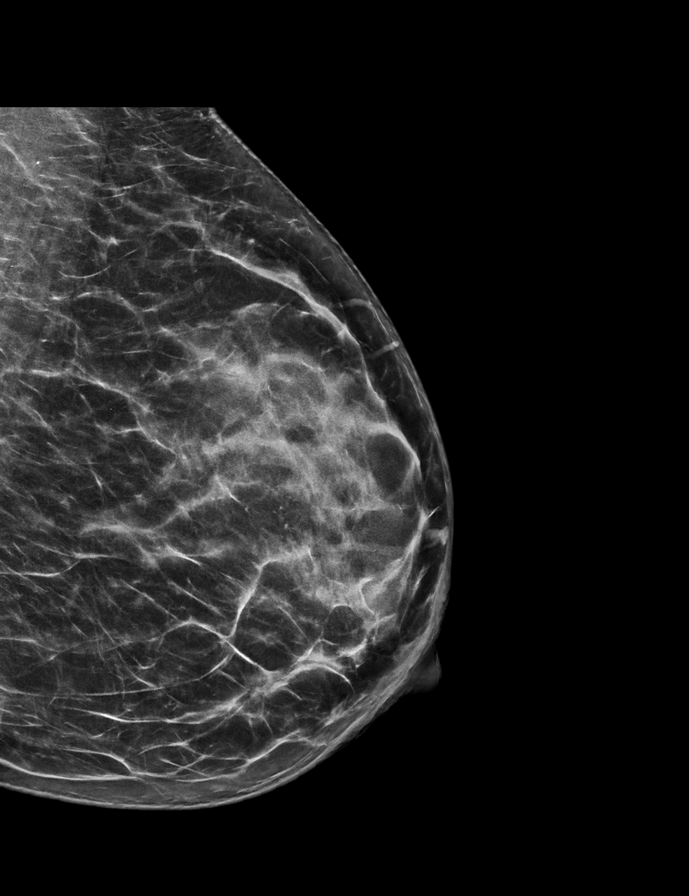

[R MLO tomo · 2 of 59 frames shown]
[frame 20/59]
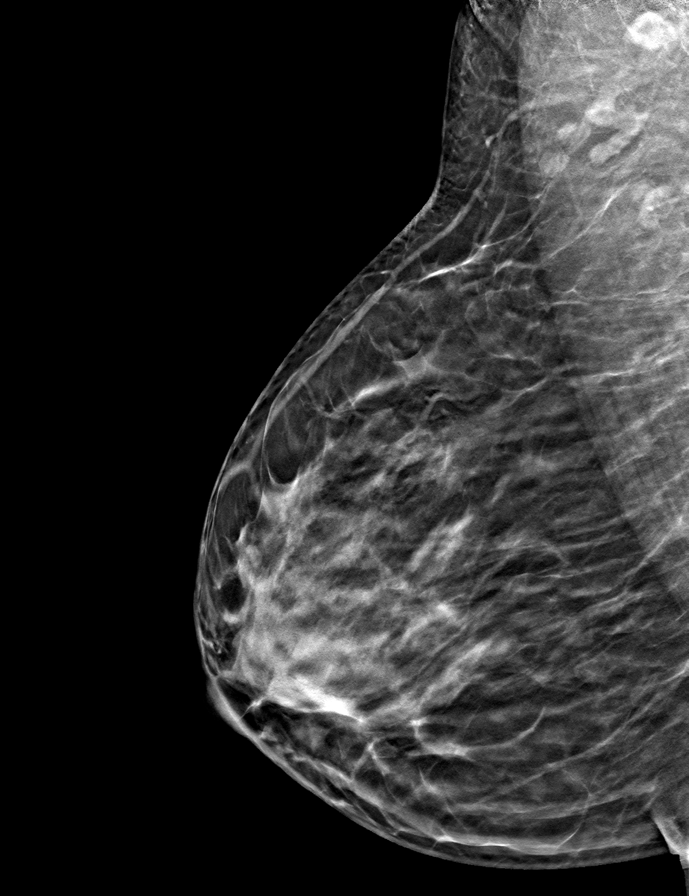
[frame 30/59]
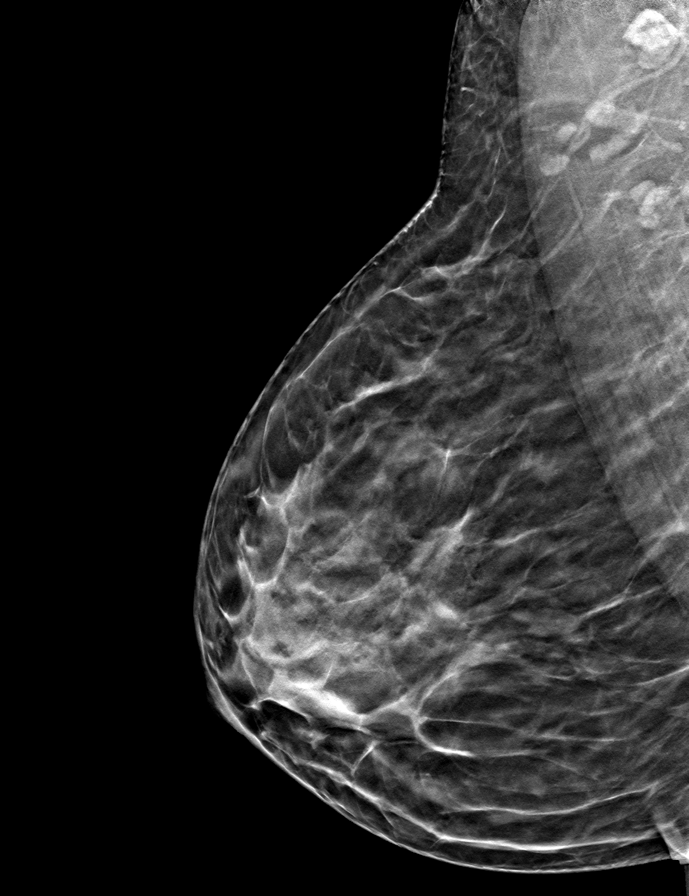

[L CC tomo · tomo slice 27/53.0]
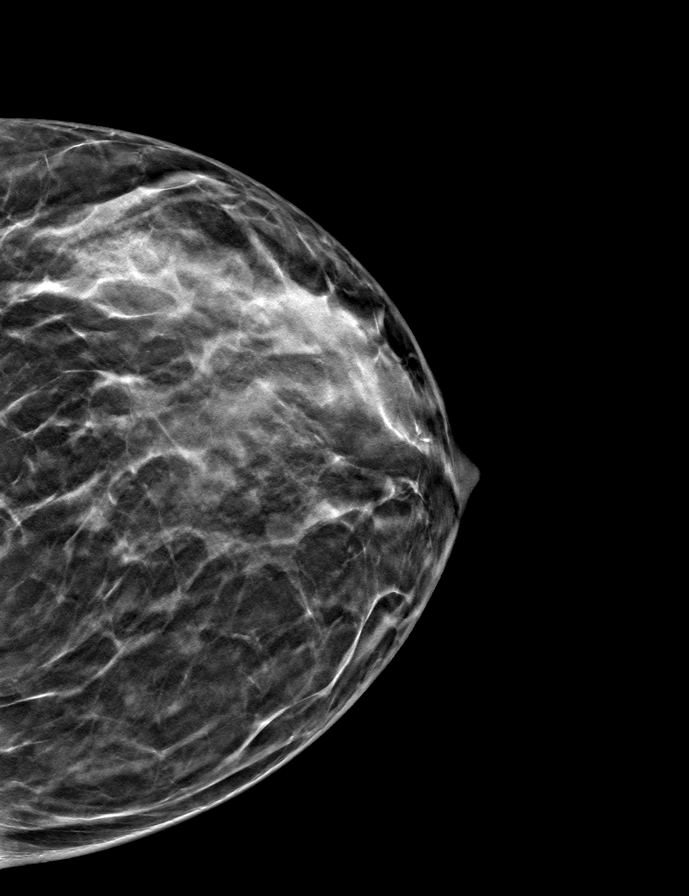

[R CC tomo · tomo slice 27/52.0]
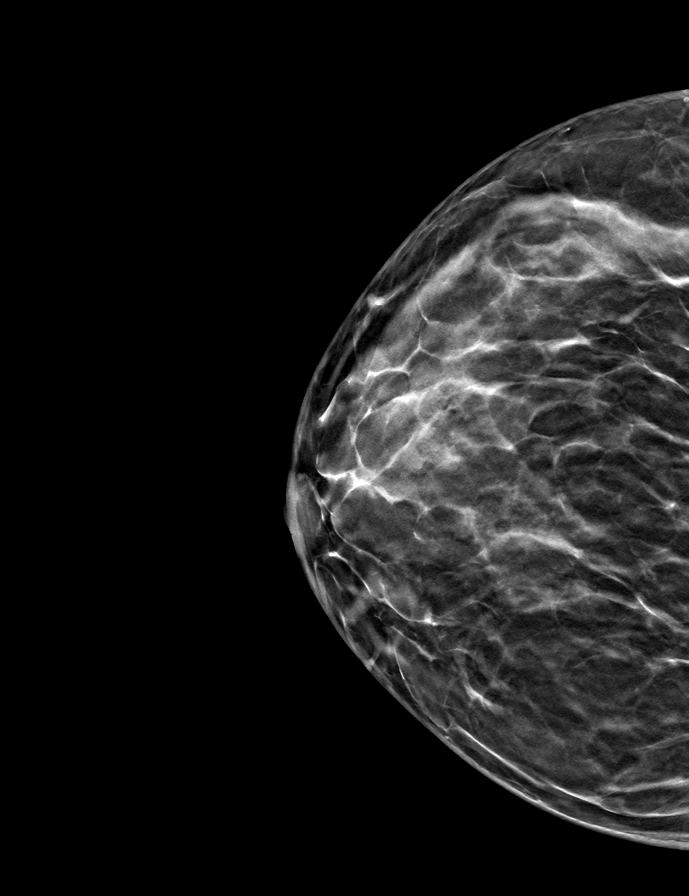

[L MLO tomo · tomo slice 31/60.0]
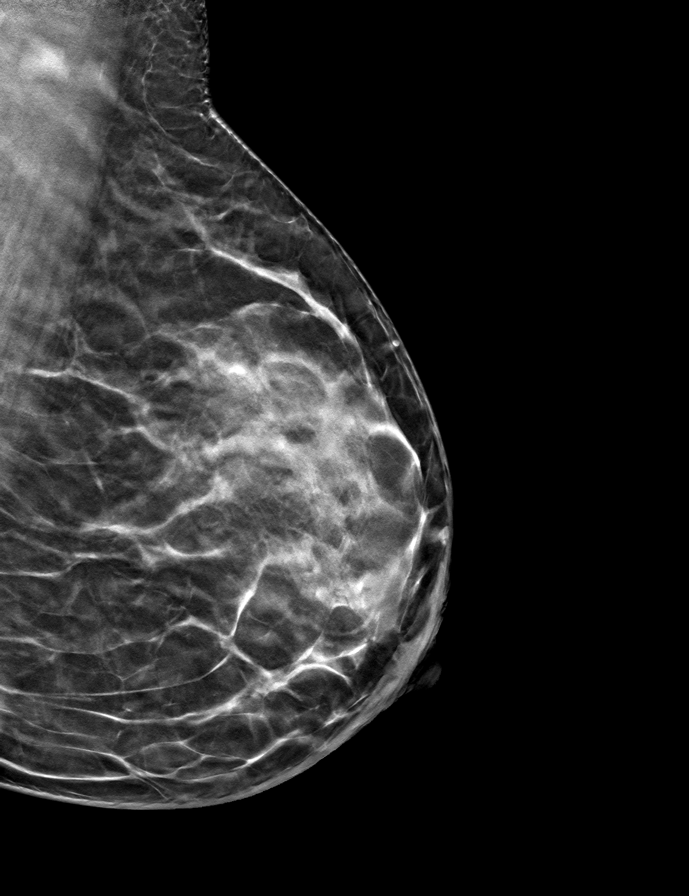

[9 of 24 positions shown; findings below may reference images not displayed]

ACR Breast Density Category c: The breast tissue is heterogeneously
dense, which may obscure small masses.
FINDINGS: There are no findings suspicious for malignancy. Images were
processed with CAD.
IMPRESSION: No mammographic evidence of malignancy. A result letter of this
screening mammogram will be mailed directly to the patient.

RECOMMENDATION:
Screening mammogram in one year. (Code:FT-U-LHB)

BI-RADS CATEGORY  1: Negative.

## 2020-04-23 ENCOUNTER — Ambulatory Visit
Admission: RE | Admit: 2020-04-23 | Discharge: 2020-04-23 | Disposition: A | Discharge status: Home / Self Care | Payer: 59 | Source: Ambulatory Visit | Attending: Family Medicine | Admitting: Family Medicine

## 2020-04-23 ENCOUNTER — Other Ambulatory Visit: Payer: Self-pay

## 2020-04-23 DIAGNOSIS — Z1231 Encounter for screening mammogram for malignant neoplasm of breast: Secondary | ICD-10-CM

## 2021-03-17 ENCOUNTER — Other Ambulatory Visit: Payer: Self-pay | Admitting: Family Medicine

## 2021-03-17 DIAGNOSIS — Z1231 Encounter for screening mammogram for malignant neoplasm of breast: Secondary | ICD-10-CM

## 2021-04-26 ENCOUNTER — Ambulatory Visit: Payer: 59

## 2021-04-28 ENCOUNTER — Ambulatory Visit
Admission: RE | Admit: 2021-04-28 | Discharge: 2021-04-28 | Disposition: A | Discharge status: Home / Self Care | Payer: 59 | Source: Ambulatory Visit | Attending: Family Medicine | Admitting: Family Medicine

## 2021-04-28 ENCOUNTER — Other Ambulatory Visit: Payer: Self-pay

## 2021-04-28 DIAGNOSIS — Z1231 Encounter for screening mammogram for malignant neoplasm of breast: Secondary | ICD-10-CM

## 2021-12-03 DIAGNOSIS — K5730 Diverticulosis of large intestine without perforation or abscess without bleeding: Secondary | ICD-10-CM | POA: Diagnosis not present

## 2021-12-03 DIAGNOSIS — Z1211 Encounter for screening for malignant neoplasm of colon: Secondary | ICD-10-CM | POA: Diagnosis not present

## 2021-12-03 DIAGNOSIS — K648 Other hemorrhoids: Secondary | ICD-10-CM | POA: Diagnosis not present

## 2022-03-17 ENCOUNTER — Other Ambulatory Visit (HOSPITAL_COMMUNITY)
Admission: RE | Admit: 2022-03-17 | Discharge: 2022-03-17 | Disposition: A | Discharge status: Home / Self Care | Payer: 59 | Source: Ambulatory Visit | Attending: Family Medicine | Admitting: Family Medicine

## 2022-03-17 ENCOUNTER — Other Ambulatory Visit: Payer: Self-pay | Admitting: Family Medicine

## 2022-03-17 DIAGNOSIS — Z79899 Other long term (current) drug therapy: Secondary | ICD-10-CM | POA: Diagnosis not present

## 2022-03-17 DIAGNOSIS — R7303 Prediabetes: Secondary | ICD-10-CM | POA: Diagnosis not present

## 2022-03-17 DIAGNOSIS — Z0000 Encounter for general adult medical examination without abnormal findings: Secondary | ICD-10-CM | POA: Diagnosis not present

## 2022-03-17 DIAGNOSIS — E8881 Metabolic syndrome: Secondary | ICD-10-CM | POA: Diagnosis not present

## 2022-03-17 DIAGNOSIS — E559 Vitamin D deficiency, unspecified: Secondary | ICD-10-CM | POA: Diagnosis not present

## 2022-03-17 DIAGNOSIS — I10 Essential (primary) hypertension: Secondary | ICD-10-CM | POA: Diagnosis not present

## 2022-03-17 DIAGNOSIS — Z01411 Encounter for gynecological examination (general) (routine) with abnormal findings: Secondary | ICD-10-CM | POA: Insufficient documentation

## 2022-03-21 ENCOUNTER — Other Ambulatory Visit: Payer: Self-pay | Admitting: Family Medicine

## 2022-03-21 DIAGNOSIS — Z1231 Encounter for screening mammogram for malignant neoplasm of breast: Secondary | ICD-10-CM

## 2022-03-21 LAB — CYTOLOGY - PAP
Comment: NEGATIVE
Diagnosis: UNDETERMINED — AB
High risk HPV: NEGATIVE

## 2022-05-12 ENCOUNTER — Ambulatory Visit
Admission: RE | Admit: 2022-05-12 | Discharge: 2022-05-12 | Disposition: A | Discharge status: Home / Self Care | Payer: 59 | Source: Ambulatory Visit | Attending: Family Medicine | Admitting: Family Medicine

## 2022-05-12 DIAGNOSIS — Z1231 Encounter for screening mammogram for malignant neoplasm of breast: Secondary | ICD-10-CM | POA: Diagnosis not present

## 2023-03-30 ENCOUNTER — Other Ambulatory Visit (HOSPITAL_COMMUNITY)
Admission: RE | Admit: 2023-03-30 | Discharge: 2023-03-30 | Disposition: A | Discharge status: Home / Self Care | Payer: 59 | Source: Ambulatory Visit | Attending: Family Medicine | Admitting: Family Medicine

## 2023-03-30 ENCOUNTER — Other Ambulatory Visit: Payer: Self-pay | Admitting: Family Medicine

## 2023-03-30 DIAGNOSIS — E559 Vitamin D deficiency, unspecified: Secondary | ICD-10-CM | POA: Diagnosis not present

## 2023-03-30 DIAGNOSIS — Z79899 Other long term (current) drug therapy: Secondary | ICD-10-CM | POA: Diagnosis not present

## 2023-03-30 DIAGNOSIS — Z01411 Encounter for gynecological examination (general) (routine) with abnormal findings: Secondary | ICD-10-CM | POA: Insufficient documentation

## 2023-03-30 DIAGNOSIS — Z0000 Encounter for general adult medical examination without abnormal findings: Secondary | ICD-10-CM | POA: Diagnosis not present

## 2023-03-30 DIAGNOSIS — E8881 Metabolic syndrome: Secondary | ICD-10-CM | POA: Diagnosis not present

## 2023-03-30 DIAGNOSIS — I10 Essential (primary) hypertension: Secondary | ICD-10-CM | POA: Diagnosis not present

## 2023-04-03 LAB — CYTOLOGY - PAP
Diagnosis: NEGATIVE
Diagnosis: REACTIVE

## 2023-04-07 ENCOUNTER — Other Ambulatory Visit: Payer: Self-pay | Admitting: Family Medicine

## 2023-04-07 DIAGNOSIS — Z1231 Encounter for screening mammogram for malignant neoplasm of breast: Secondary | ICD-10-CM

## 2023-05-16 ENCOUNTER — Ambulatory Visit
Admission: RE | Admit: 2023-05-16 | Discharge: 2023-05-16 | Disposition: A | Discharge status: Home / Self Care | Payer: 59 | Source: Ambulatory Visit | Attending: Family Medicine | Admitting: Family Medicine

## 2023-05-16 DIAGNOSIS — Z1231 Encounter for screening mammogram for malignant neoplasm of breast: Secondary | ICD-10-CM | POA: Diagnosis not present

## 2024-03-28 NOTE — Progress Notes (Signed)
 Akiera Allbaugh [facility]                                          MRN: [id number]   03/28/2024   The [facility] Quality Team Specialist reviewed this patient medical record for the purposes of chart review for care gap closure. The following were reviewed: chart review for care gap closure-controlling blood pressure.    [facility] Quality Team

## 2024-04-11 ENCOUNTER — Other Ambulatory Visit: Payer: Self-pay | Admitting: Family Medicine

## 2024-04-11 DIAGNOSIS — Z1231 Encounter for screening mammogram for malignant neoplasm of breast: Secondary | ICD-10-CM

## 2024-05-16 ENCOUNTER — Ambulatory Visit
